# Patient Record
Sex: Male | Born: 1975 | Race: White | Hispanic: No | Marital: Married | Smoking: Never smoker
Health system: Southern US, Community
[De-identification: ages and names within clinical notes are randomized; demographics above are authoritative.]

## PROBLEM LIST (undated history)

## (undated) DIAGNOSIS — F32A Depression, unspecified: Secondary | ICD-10-CM

## (undated) DIAGNOSIS — R0602 Shortness of breath: Secondary | ICD-10-CM

## (undated) DIAGNOSIS — R600 Localized edema: Secondary | ICD-10-CM

## (undated) DIAGNOSIS — M549 Dorsalgia, unspecified: Secondary | ICD-10-CM

## (undated) DIAGNOSIS — M2550 Pain in unspecified joint: Secondary | ICD-10-CM

## (undated) DIAGNOSIS — Z9889 Other specified postprocedural states: Secondary | ICD-10-CM

## (undated) HISTORY — DX: Shortness of breath: R06.02

## (undated) HISTORY — PX: BACK SURGERY: SHX140

## (undated) HISTORY — PX: KNEE SURGERY: SHX244

## (undated) HISTORY — PX: TIBIA FRACTURE SURGERY: SHX806

## (undated) HISTORY — DX: Pain in unspecified joint: M25.50

## (undated) HISTORY — DX: Localized edema: R60.0

## (undated) HISTORY — DX: Other specified postprocedural states: Z98.890

## (undated) HISTORY — PX: ANKLE SURGERY: SHX546

## (undated) HISTORY — DX: Dorsalgia, unspecified: M54.9

## (undated) HISTORY — PX: HIP SURGERY: SHX245

## (undated) HISTORY — DX: Depression, unspecified: F32.A

---

## 2021-10-05 ENCOUNTER — Emergency Department: Payer: 59

## 2021-10-05 ENCOUNTER — Emergency Department
Admission: EM | Admit: 2021-10-05 | Discharge: 2021-10-05 | Disposition: A | Discharge status: Home / Self Care | Payer: 59 | Attending: Student in an Organized Health Care Education/Training Program | Admitting: Student in an Organized Health Care Education/Training Program

## 2021-10-05 ENCOUNTER — Other Ambulatory Visit: Payer: Self-pay

## 2021-10-05 DIAGNOSIS — G8911 Acute pain due to trauma: Secondary | ICD-10-CM | POA: Insufficient documentation

## 2021-10-05 DIAGNOSIS — M5414 Radiculopathy, thoracic region: Secondary | ICD-10-CM | POA: Diagnosis not present

## 2021-10-05 DIAGNOSIS — R102 Pelvic and perineal pain: Secondary | ICD-10-CM | POA: Diagnosis not present

## 2021-10-05 DIAGNOSIS — Y92410 Unspecified street and highway as the place of occurrence of the external cause: Secondary | ICD-10-CM | POA: Insufficient documentation

## 2021-10-05 DIAGNOSIS — R52 Pain, unspecified: Secondary | ICD-10-CM

## 2021-10-05 DIAGNOSIS — M545 Low back pain, unspecified: Secondary | ICD-10-CM | POA: Diagnosis present
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
## 2021-10-05 MED ORDER — PREDNISONE 10 MG (21) PO TBPK: ORAL_TABLET | ORAL | 0 refills | Status: AC

## 2021-10-05 MED ORDER — GADOBUTROL 1 MMOL/ML IV SOLN
10.0000 mL | Freq: Once | INTRAVENOUS | Status: AC | PRN
  Administered 2021-10-05: 10 mL via INTRAVENOUS
  Filled 2021-10-05: qty 10

## 2021-10-05 NOTE — ED Notes (Signed)
Pt remains away at imaging.  
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
## 2021-10-05 NOTE — Discharge Instructions (Addendum)
-  Please return to the emergency department anytime if you begin to experience any new or worsening symptoms. -Please take the steroids as prescribed. -Use Tylenol/ibuprofen as needed for pain.
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
## 2021-10-14 ENCOUNTER — Other Ambulatory Visit: Payer: Self-pay

## 2021-10-14 ENCOUNTER — Encounter: Payer: Self-pay | Admitting: Dermatology

## 2021-10-14 ENCOUNTER — Ambulatory Visit (INDEPENDENT_AMBULATORY_CARE_PROVIDER_SITE_OTHER): Payer: 59 | Admitting: Dermatology

## 2021-10-14 DIAGNOSIS — L409 Psoriasis, unspecified: Secondary | ICD-10-CM | POA: Diagnosis not present

## 2021-10-14 NOTE — Patient Instructions (Addendum)
If not started on Skyrizi when due for next Cimzia injected continue Cimzia.   If You Need Anything After Your Visit  If you have any questions or concerns for your doctor, please call our main line at [phone] and press option 4 to reach your doctor's medical assistant. If no one answers, please leave a voicemail as directed and we will return your call as soon as possible. Messages left after 4 pm will be answered the following business day.   You may also send [place] a message via MyChart. We typically respond to MyChart messages within 1-2 business days.  For prescription refills, please ask your pharmacy to contact our office. Our fax number is [phone].  If you have an urgent issue when the clinic is closed that cannot wait until the next business day, you can page your doctor at the number below.    Please note that while we do our best to be available for urgent issues outside of office hours, we are not available 24/7.   If you have an urgent issue and are unable to reach [place], you may choose to seek medical care at your doctor's office, retail clinic, urgent care center, or emergency room.  If you have a medical emergency, please immediately call 911 or go to the emergency department.  Pager Numbers  - Dr. Gwen Pounds: [phone]  - Dr. Neale Burly: [phone]  - Dr. Roseanne Reno: [phone]  In the event of inclement weather, please call our main line at [phone] for an update on the status of any delays or closures.  Dermatology Medication Tips: Please keep the boxes that topical medications come in in order to help keep track of the instructions about where and how to use these. Pharmacies typically print the medication instructions only on the boxes and not directly on the medication tubes.   If your medication is too expensive, please contact our office at [phone] option 4 or send [place] a message through MyChart.   We are unable to tell what your co-pay for medications  will be in advance as this is different depending on your insurance coverage. However, we may be able to find a substitute medication at lower cost or fill out paperwork to get insurance to cover a needed medication.   If a prior authorization is required to get your medication covered by your insurance company, please allow [place] 1-2 business days to complete this process.  Drug prices often vary depending on where the prescription is filled and some pharmacies may offer cheaper prices.  The website www.goodrx.com contains coupons for medications through different pharmacies. The prices here do not account for what the cost may be with help from insurance (it may be cheaper with your insurance), but the website can give you the price if you did not use any insurance.  - You can print the associated coupon and take it with your prescription to the pharmacy.  - You may also stop by our office during regular business hours and pick up a GoodRx coupon card.  - If you need your prescription sent electronically to a different pharmacy, notify our office through [facility] or by phone at [phone] option 4.     Si Usted Necesita Algo Despus de Su Visita  Tambin puede enviarnos un mensaje a travs de Clinical cytogeneticist. Por lo general respondemos a los mensajes de MyChart en el transcurso de 1 a 2 das hbiles.  Para renovar recetas, por favor pida a su farmacia que se ponga  en contacto con nuestra oficina. Annie Sable de fax es [place] [phone].  Si tiene un asunto urgente cuando la clnica est cerrada y que no puede esperar hasta el siguiente da hbil, puede llamar/localizar a su doctor(a) al nmero que aparece a continuacin.   Por favor, tenga en cuenta que aunque hacemos todo lo posible para estar disponibles para asuntos urgentes fuera del horario de [place], no estamos disponibles las 24 horas del da, los 7 [address]  [address] de la [place].   Si tiene un problema urgente y no puede comunicarse con  nosotros, puede optar por buscar atencin mdica  en el consultorio de su doctor(a), en una clnica privada, en un centro de atencin urgente o en una sala de emergencias.  Si tiene Engineer, drilling, por favor llame inmediatamente al 911 o vaya a la sala de emergencias.  Nmeros de bper  - Dr. Gwen Pounds: [phone]  - Dra. Moye: [phone]  - Dra. Roseanne Reno: [phone]  En caso de inclemencias del [place], por favor llame a Lacy Duverney principal al [phone] para una actualizacin sobre el [place] de cualquier retraso o cierre.  Consejos para la medicacin en dermatologa: Por favor, guarde las cajas en las que vienen los medicamentos de uso tpico para ayudarle a seguir las instrucciones sobre dnde y cmo usarlos. Las farmacias generalmente imprimen las instrucciones del medicamento slo en las cajas y no directamente en los tubos del [place].   Si su medicamento es muy caro, por favor, pngase en contacto con Rolm Gala llamando al [phone] y presione la opcin 4 o envenos un mensaje a travs de Clinical cytogeneticist.   No podemos decirle cul ser su copago por los medicamentos por adelantado ya que esto es diferente dependiendo de la cobertura de su seguro. Sin embargo, es posible que podamos encontrar un medicamento sustituto a Audiological scientist un formulario para que el seguro cubra el medicamento que se considera necesario.   Si se requiere una autorizacin previa para que su compaa de seguros [place] su medicamento, por favor permtanos de 1 a 2 das hbiles para completar [address]  Los precios de los medicamentos varan con frecuencia dependiendo del Environmental consultant de dnde se surte la receta y alguna farmacias pueden ofrecer precios ms baratos.  El sitio web www.goodrx.com tiene cupones para medicamentos de Health and safety inspector. Los precios aqu no tienen en cuenta lo que podra costar con la ayuda del seguro (puede ser ms barato con su seguro), pero el sitio web  puede darle el precio si no utiliz Tourist information centre manager.  - Puede imprimir el cupn correspondiente y llevarlo con su receta a la farmacia.  - Tambin puede pasar por nuestra oficina durante el horario de atencin regular y Education officer, museum una tarjeta de cupones de GoodRx.  - Si necesita que su receta se enve electrnicamente a una farmacia diferente, informe a nuestra oficina a travs de MyChart de [place] o por telfono llamando al [phone] y presione la opcin 4.

## 2021-10-14 NOTE — Progress Notes (Signed)
° °  New Patient Visit  Subjective  Danny Chandler is a 46 y.o. male who presents for the following: Psoriasis (Patient reports this comes and goes. Using Betamethasone cream. Has used Clobetasol cream in the past. Currently on oral Prednisone due to injury/accident. Hands, knees, elbows. Breaking out on chest/upper body while on Cimzia. Patient reports he had labs drawn at Dr. Dwana Curd office. Rash at right leg. Has been seen by Dr. Cheree Ditto. Dur: >1 year. Worsening. Spreading. Itches at times if overheated. ). No history of joint aches.  Referral from Dr. Carlynn Purl.   Review of Systems: No other skin or systemic complaints except as noted in HPI or Assessment and Plan.  Objective  Well appearing patient in no apparent distress; mood and affect are within normal limits.  A full examination was performed including scalp, head, eyes, ears, nose, lips, neck, chest, axillae, abdomen, back, buttocks, bilateral upper extremities, bilateral lower extremities, hands, feet, fingers, toes, fingernails, and toenails. All findings within normal limits unless otherwise noted below.  Right Lower Leg, B/L hands Well-demarcated erythematous papules/plaques with silvery scale.           Assessment & Plan  Psoriasis -severe with BSA of 12% while actively on and currently on Cimzia. The patient states he is much improved on Cimzia but has significant persistence of psoriasis and wonders if there is other options for improvement. No symptoms or signs of psoriatic arthritis. Right Lower Leg, B/L hands  Psoriasis is a chronic non-curable, but treatable genetic/hereditary disease that may have other systemic features affecting other organ systems such as joints (Psoriatic Arthritis). It is associated with an increased risk of inflammatory bowel disease, heart disease, non-alcoholic fatty liver disease, and depression.    BSA: 12% on Tx for 6 months with Cimzia  Will request labs from Dr.  Dwana Curd office.  Plan to start [facility] pending normal labs and insurance approval.  Lab order given today to use if needed.   If not started on Skyrizi when due for next Cimzia injected continue Cimzia.   Related Procedures Hepatitis B surface antigen Hepatitis B surface antibody,qualitative Hepatitis C antibody QuantiFERON-TB Gold Plus Hepatitis B core antibody, total  Return for Psoriasis Follow Up. 3-4 weeks.  I, Lawson Radar, CMA, am acting as scribe for Armida Sans, MD. Documentation: I have reviewed the above documentation for accuracy and completeness, and I agree with the above.  Armida Sans, MD

## 2021-10-15 ENCOUNTER — Encounter: Payer: Self-pay | Admitting: Dermatology

## 2021-10-22 ENCOUNTER — Ambulatory Visit
Admission: RE | Admit: 2021-10-22 | Discharge: 2021-10-22 | Disposition: A | Discharge status: Home / Self Care | Payer: 59 | Source: Ambulatory Visit | Attending: Internal Medicine | Admitting: Internal Medicine

## 2021-10-22 ENCOUNTER — Other Ambulatory Visit: Payer: Self-pay | Admitting: Internal Medicine

## 2021-10-22 DIAGNOSIS — R1084 Generalized abdominal pain: Secondary | ICD-10-CM

## 2021-10-22 DIAGNOSIS — K625 Hemorrhage of anus and rectum: Secondary | ICD-10-CM

## 2021-10-24 ENCOUNTER — Other Ambulatory Visit: Payer: Self-pay

## 2021-10-24 ENCOUNTER — Ambulatory Visit
Admission: RE | Admit: 2021-10-24 | Discharge: 2021-10-24 | Disposition: A | Discharge status: Home / Self Care | Payer: 59 | Source: Ambulatory Visit | Attending: Internal Medicine | Admitting: Internal Medicine

## 2021-10-24 DIAGNOSIS — R1084 Generalized abdominal pain: Secondary | ICD-10-CM | POA: Diagnosis present

## 2021-10-24 DIAGNOSIS — K625 Hemorrhage of anus and rectum: Secondary | ICD-10-CM | POA: Insufficient documentation

## 2021-10-24 IMAGING — CT CT ABD-PELV W/ CM
2 of 5 series · 15 of 46 positions shown, 17 images · IV contrast (APPLIED)
Comparison: CT L-spine [DATE]

CLINICAL DATA: Abdominal pain with rectal bleeding since [DATE]

EXAM:
CT ABDOMEN AND PELVIS WITH CONTRAST
TECHNIQUE: Multidetector CT imaging of the abdomen and pelvis was performed
using the standard protocol following bolus administration of
intravenous contrast.

[Series 2: routine abd/pel with · axial · 0.94mm/px · z∈[-1250,-755]mm · 12 of 113 slices shown, 14 images]
[im 7/113  soft-tissue]
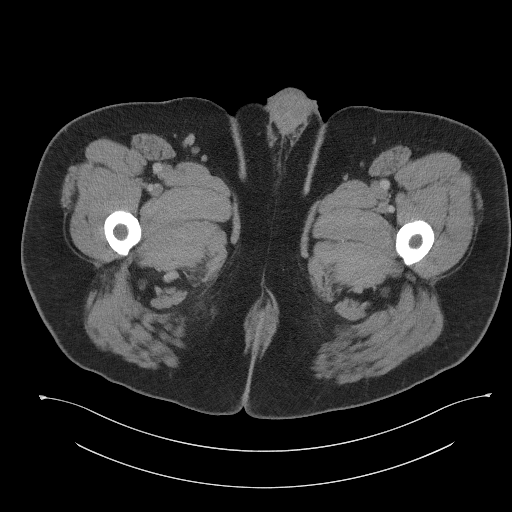
[im 7/113  bone]
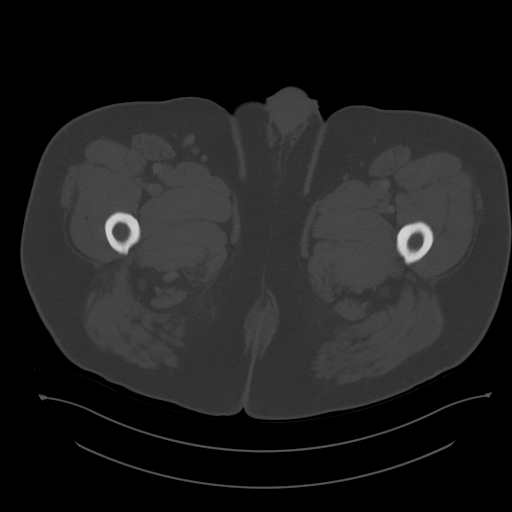
[im 19/113  soft-tissue]
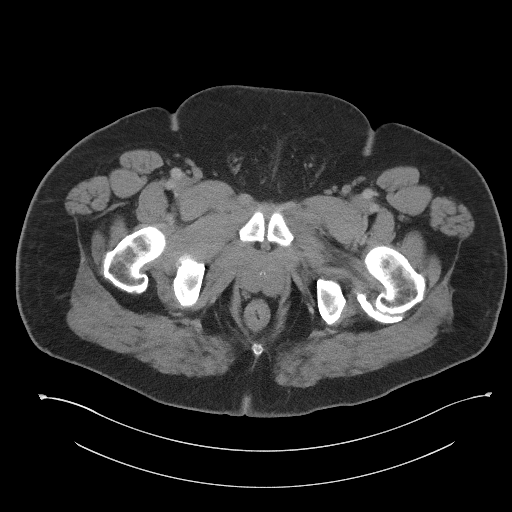
[im 25/113  soft-tissue]
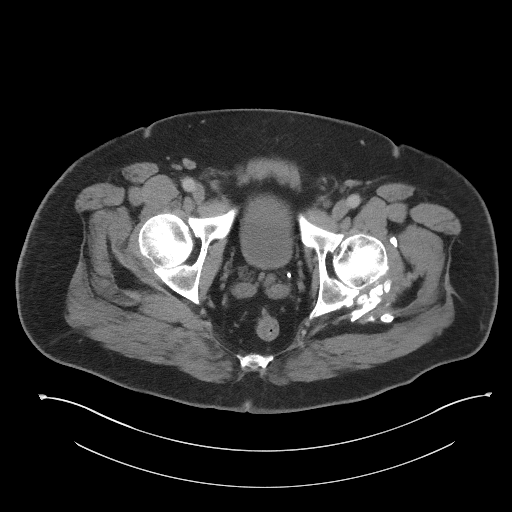
[im 32/113  soft-tissue]
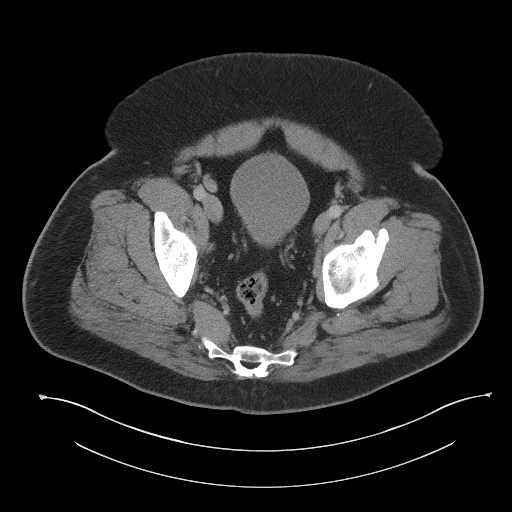
[im 44/113  soft-tissue]
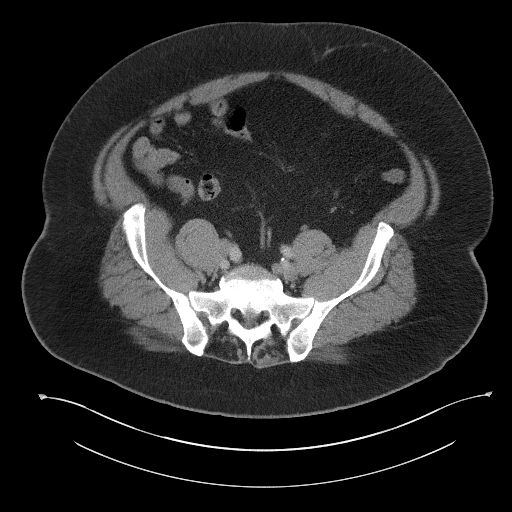
[im 50/113  soft-tissue]
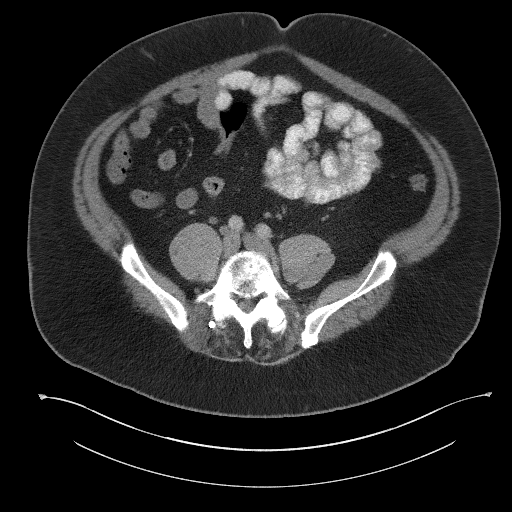
[im 63/113  soft-tissue]
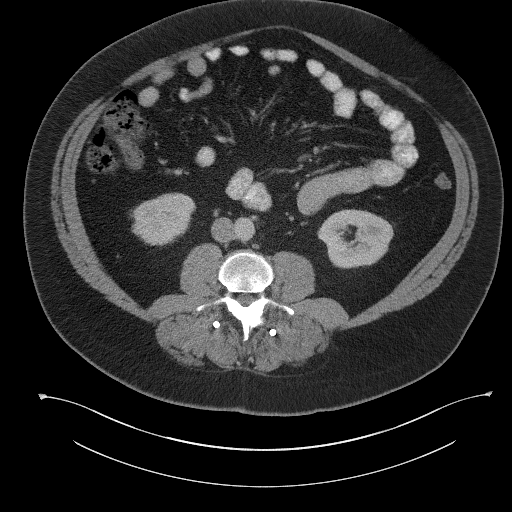
[im 69/113  soft-tissue]
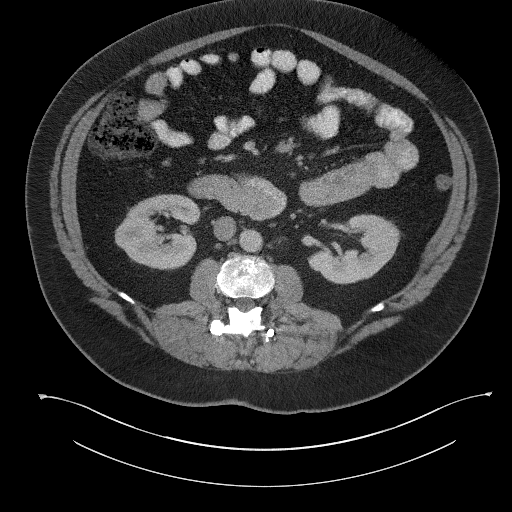
[im 81/113  soft-tissue]
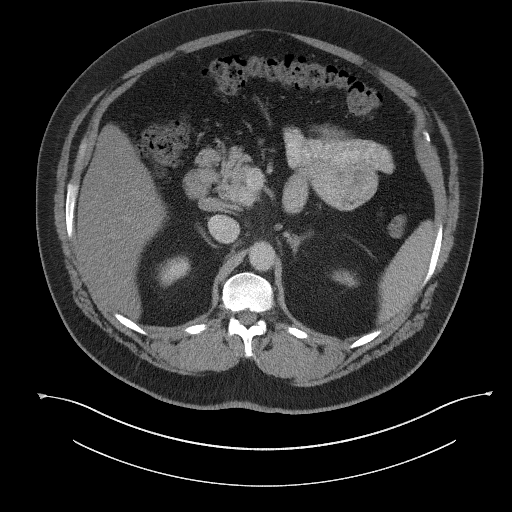
[im 81/113  bone]
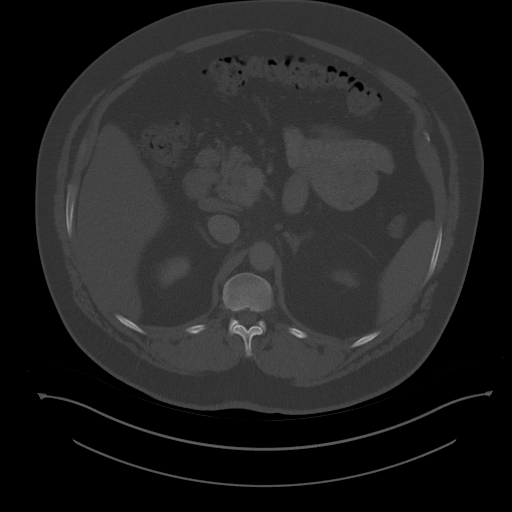
[im 88/113  soft-tissue]
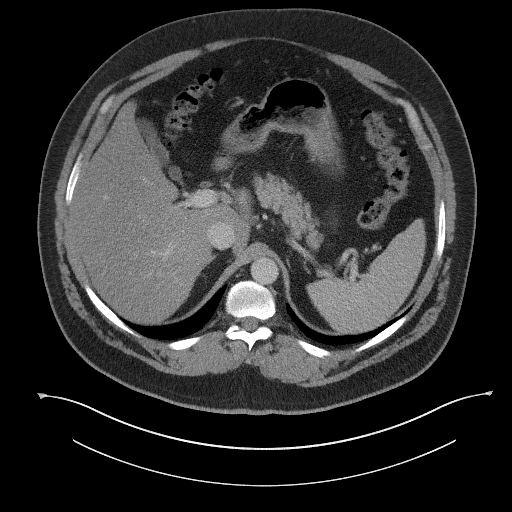
[im 94/113  soft-tissue]
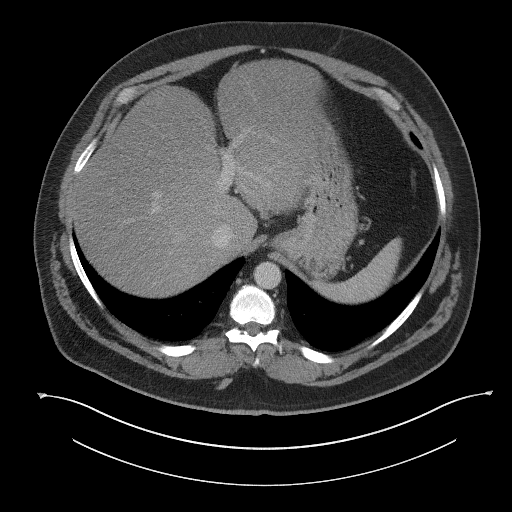
[im 106/113  soft-tissue]
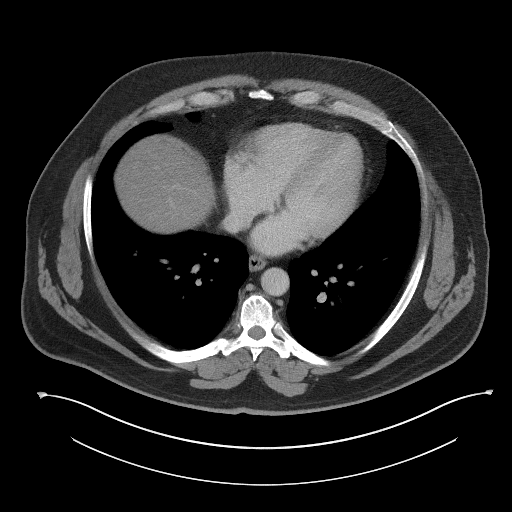

[Series 5: coronal st · coronal · 0.92mm/px · 3 of 134 slices shown]
[im 45/134  soft-tissue]
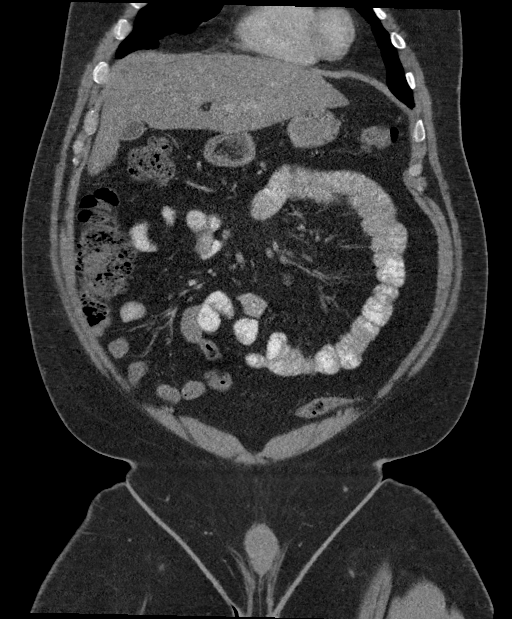
[im 60/134  soft-tissue]
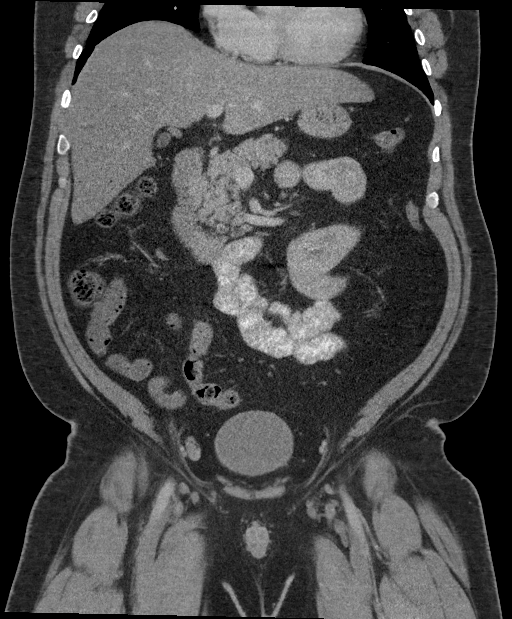
[im 74/134  soft-tissue]
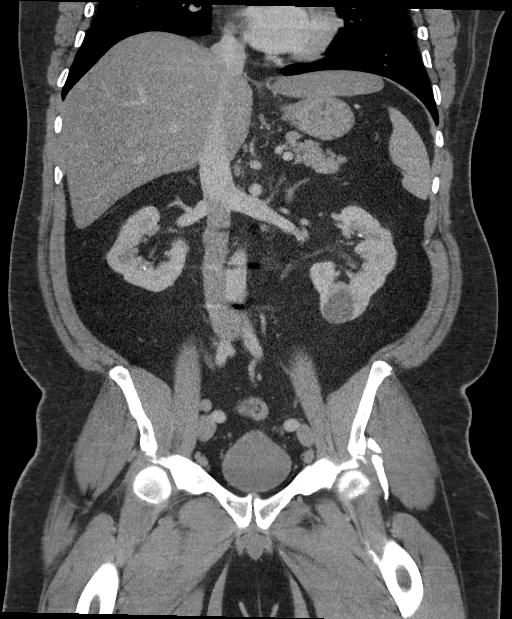

[15 of 46 positions shown; findings below may reference images not displayed]

RADIATION DOSE REDUCTION: This exam was performed according to the
departmental dose-optimization program which includes automated
exposure control, adjustment of the mA and/or kV according to
patient size and/or use of iterative reconstruction technique.

CONTRAST:  100mL OMNIPAQUE IOHEXOL 350 MG/ML SOLN
FINDINGS: Lower chest: No acute abnormality.

Hepatobiliary: Hepatic steatosis. Gallbladder is unremarkable. No
biliary ductal dilation.

Pancreas: No pancreatic ductal dilation or evidence of acute
inflammation

Spleen: Normal in size without focal abnormality.

Adrenals/Urinary Tract: Bilateral adrenal glands appear normal. No
hydronephrosis. Nonobstructive punctate bilateral renal stones
measuring up to 4 mm. Hypodense 3.2 cm left lower pole renal lesion
demonstrates some mural calcification and Hounsfield units slightly
greater than that expected for a simple cyst. Urinary bladder is
unremarkable for degree of distension.

Stomach/Bowel: Radiopaque ingested material traverses distal loops
of small bowel. Stomach is unremarkable for degree of distension. No
pathologic dilation of small or large bowel. The appendix and
terminal ileum appear normal. Small volume of formed stool in the
ascending and transverse colon with the descending and sigmoid colon
relatively decompressed limiting evaluation. There is mild symmetric
wall thickening of the rectum which may reflect underdistention.

Vascular/Lymphatic: No abdominal aortic aneurysm. No pathologically
enlarged abdominal or pelvic lymph nodes.

Reproductive: Prostate is unremarkable.

Other: No significant abdominopelvic free fluid.

Musculoskeletal: Posterior lumbar fixation hardware with inter body
disc spacers. Heterotopic ossification and irregularity about the
left hip likely reflect sequela of prior trauma. No acute osseous
abnormality.
IMPRESSION: 1. Mild symmetric wall thickening of the rectum which may reflect
underdistention, consider clinical correlation in this patient with
reported rectal bleeding.
2. Hepatic steatosis.
3. Nonobstructive punctate bilateral renal stones measuring up to 4
mm.
4. Hypodense 3.2 cm left lower pole renal lesion demonstrates some
mural calcification and Hounsfield units slightly greater than that
expected for a simple cyst, possibly reflecting a
hemorrhagic/proteinaceous cyst however renal neoplasm not excluded.
Consider further evaluation with nonemergent renal protocol CT or
MRI with and without contrast.

## 2021-10-24 MED ORDER — IOHEXOL 350 MG/ML SOLN
100.0000 mL | Freq: Once | INTRAVENOUS | Status: AC | PRN
  Administered 2021-10-24: 100 mL via INTRAVENOUS

## 2021-10-25 ENCOUNTER — Other Ambulatory Visit: Payer: Self-pay | Admitting: Internal Medicine

## 2021-10-25 DIAGNOSIS — N2889 Other specified disorders of kidney and ureter: Secondary | ICD-10-CM

## 2021-10-28 ENCOUNTER — Telehealth: Payer: Self-pay

## 2021-10-28 NOTE — Telephone Encounter (Signed)
Patient's records from [facility] in history (under chart review and then media).   Patient is due for Cimzia but last note you stated to send in Skyrizi to start PA process. Okay to do now?  Labs were placed in your tray to be signed off on.

## 2021-10-29 ENCOUNTER — Other Ambulatory Visit: Payer: Self-pay | Admitting: Family Medicine

## 2021-10-29 DIAGNOSIS — M5412 Radiculopathy, cervical region: Secondary | ICD-10-CM

## 2021-10-29 MED ORDER — SKYRIZI PEN 150 MG/ML ~~LOC~~ SOAJ: 150.0000 mg | SUBCUTANEOUS | 2 refills | Status: DC

## 2021-10-29 MED ORDER — SKYRIZI PEN 150 MG/ML ~~LOC~~ SOAJ: 150.0000 mg | SUBCUTANEOUS | 1 refills | Status: DC

## 2021-10-29 NOTE — Telephone Encounter (Signed)
Skyrizi RX sent in to [facility] and patient advised. aw

## 2021-10-30 ENCOUNTER — Ambulatory Visit
Admission: RE | Admit: 2021-10-30 | Discharge: 2021-10-30 | Disposition: A | Discharge status: Home / Self Care | Payer: 59 | Source: Ambulatory Visit | Attending: Family Medicine | Admitting: Family Medicine

## 2021-10-30 DIAGNOSIS — M5412 Radiculopathy, cervical region: Secondary | ICD-10-CM | POA: Insufficient documentation
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
## 2021-11-03 ENCOUNTER — Ambulatory Visit: Payer: 59

## 2021-11-10 ENCOUNTER — Ambulatory Visit: Payer: 59

## 2021-11-11 ENCOUNTER — Ambulatory Visit: Payer: 59 | Admitting: Dermatology

## 2021-11-27 NOTE — Progress Notes (Signed)
11/29/21 8:01 AM   Danny Chandler 1976/02/27 [id number]  Referring provider:  Danella Penton, MD [id number] [facility] [address] [facility] [place],  [place] [address] Chief Complaint  Patient presents with   Urinary Frequency     HPI: Danny Chandler is a 46 y.o.male who presents today for further evaluation of renal cyst.   He was seen by his PCP on 10/22/2021 with concerns of rectal bleeding and abdominal pain that began on 10/09/2021. CT abdomen and pelvis on 10/24/2021 to further evaluate visualized Nonobstructive punctate bilateral renal stones measuring up to 20mm. Hypodense 3.2 cm left lower pole renal lesion demonstrates some mural calcification and Hounsfield units slightly greater than that expected for a simple cyst, possibly reflecting a hemorrhagic/proteinaceous cyst however renal neoplasm not excluded.   His PCP further ordered an MRI for the left lower kidney cyst.  Unfortunately, today he reports he is having trouble getting this approved by insurance.  This is something to do with outstanding Work[employer].  He also reports today that he is having increasing urinary frequency.  He reports that he has times when he feels he is not voiding fully.   He reports today that he has not had any previous kidney stones before. He reports that he drink [employer] and Dr Reino Kent he reports that he eats spinach at least 4 times weekly.   PMH: No past medical history on file.  Surgical History: Past Surgical History:  Procedure Laterality Date   ANKLE SURGERY Left    ANKLE SURGERY Right    BACK SURGERY     HIP SURGERY Left    KNEE SURGERY     KNEE SURGERY Right    TIBIA FRACTURE SURGERY Left     Home Medications:  Allergies as of 11/28/2021       Reactions   Bee Venom Anaphylaxis   Penicillins Anaphylaxis        Medication List        Accurate as of November 28, 2021 11:59 PM. If you have any questions, ask your nurse or doctor.           betamethasone dipropionate 0.05 % cream SMARTSIG:Sparingly Topical Daily   Cimzia Prefilled 2 X 200 MG/ML Pskt Generic drug: certolizumab pegol Inject into the skin.   clobetasol cream 0.05 % Commonly known as: TEMOVATE Apply topically 2 (two) times daily.   cyanocobalamin 1000 MCG/ML injection Commonly known as: (VITAMIN B-12) Inject 1,000 mcg into the muscle every 30 (thirty) days.   Skyrizi Pen 150 MG/ML Soaj Generic drug: Risankizumab-rzaa Inject 150 mg into the skin as directed. At weeks 0 & 4.   Skyrizi Pen 150 MG/ML Soaj Generic drug: Risankizumab-rzaa Inject 150 mg into the skin as directed. Every 12 weeks for maintenance.        Allergies:  Allergies  Allergen Reactions   Bee Venom Anaphylaxis   Penicillins Anaphylaxis    Family History: No family history on file.  Social History:  reports that he has never smoked. He has never used smokeless tobacco. He reports current alcohol use. No history on file for drug use.   Physical Exam: BP (!) 138/93    Pulse 92    Ht 6\' 1"  (1.854 m)    Wt (!) 318 lb (144.2 kg)    BMI 41.96 kg/m   Constitutional:  Alert and oriented, No acute distress. HEENT: [place] AT, moist mucus membranes.  Trachea midline, no masses. Cardiovascular: No clubbing, cyanosis, or edema.  Respiratory: Normal respiratory effort, no increased work of breathing. Skin: No rashes, bruises or suspicious lesions. Neurologic: Grossly intact, no focal deficits, moving all 4 extremities. Psychiatric: Normal mood and affect.  Urinalysis Results for orders placed or performed in visit on 11/28/21  Microscopic Examination   Urine  Result Value Ref Range   WBC, UA 0-5 0 - 5 /hpf   RBC 0-2 0 - 2 /hpf   Epithelial Cells (non renal) None seen 0 - 10 /hpf   Mucus, UA Present (A) Not Estab.   Bacteria, UA None seen None seen/Few  Urinalysis, Complete  Result Value Ref Range   Specific Gravity, UA >1.030 (H) 1.005 - 1.030   pH, UA 5.5 5.0 - 7.5   Color,  UA Yellow Yellow   Appearance Ur Clear Clear   Leukocytes,UA Negative Negative   Protein,UA 1+ (A) Negative/Trace   Glucose, UA Negative Negative   Ketones, UA Negative Negative   RBC, UA Negative Negative   Bilirubin, UA Negative Negative   Urobilinogen, Ur 1.0 0.2 - 1.0 mg/dL   Nitrite, UA Negative Negative   Microscopic Examination See below:      Pertinent Imaging: CLINICAL DATA:  Abdominal pain with rectal bleeding since October 09, 2021   EXAM: CT ABDOMEN AND PELVIS WITH CONTRAST   TECHNIQUE: Multidetector CT imaging of the abdomen and pelvis was performed using the standard protocol following bolus administration of intravenous contrast.   RADIATION DOSE REDUCTION: This exam was performed according to the departmental dose-optimization program which includes automated exposure control, adjustment of the mA and/or kV according to patient size and/or use of iterative reconstruction technique.   CONTRAST:  OMNIPAQUE IOHEXOL 350 MG/ML SOLN   COMPARISON:  CT L-spine October 05, 2021   FINDINGS: Lower chest: No acute abnormality.   Hepatobiliary: Hepatic steatosis. Gallbladder is unremarkable. No biliary ductal dilation.   Pancreas: No pancreatic ductal dilation or evidence of acute inflammation   Spleen: Normal in size without focal abnormality.   Adrenals/Urinary Tract: Bilateral adrenal glands appear normal. No hydronephrosis. Nonobstructive punctate bilateral renal stones measuring up to 4 mm. Hypodense 3.2 cm left lower pole renal lesion demonstrates some mural calcification and Hounsfield units slightly greater than that expected for a simple cyst. Urinary bladder is unremarkable for degree of distension.   Stomach/Bowel: Radiopaque ingested material traverses distal loops of small bowel. Stomach is unremarkable for degree of distension. No pathologic dilation of small or large bowel. The appendix and terminal ileum appear normal. Small volume of  formed stool in the ascending and transverse colon with the descending and sigmoid colon relatively decompressed limiting evaluation. There is mild symmetric wall thickening of the rectum which may reflect underdistention.   Vascular/Lymphatic: No abdominal aortic aneurysm. No pathologically enlarged abdominal or pelvic lymph nodes.   Reproductive: Prostate is unremarkable.   Other: No significant abdominopelvic free fluid.   Musculoskeletal: Posterior lumbar fixation hardware with inter body disc spacers. Heterotopic ossification and irregularity about the left hip likely reflect sequela of prior trauma. No acute osseous abnormality.   IMPRESSION: 1. Mild symmetric wall thickening of the rectum which may reflect underdistention, consider clinical correlation in this patient with reported rectal bleeding. 2. Hepatic steatosis. 3. Nonobstructive punctate bilateral renal stones measuring up to 4 mm. 4. Hypodense 3.2 cm left lower pole renal lesion demonstrates some mural calcification and Hounsfield units slightly greater than that expected for a simple cyst, possibly reflecting a hemorrhagic/proteinaceous cyst however renal neoplasm not excluded. Consider further evaluation with  nonemergent renal protocol CT or MRI with and without contrast.     Electronically Signed   By: Maudry Mayhew M.D.   On: 10/24/2021 10:35   I have personally reviewed the images and agree with radiologist interpretation.   Assessment & Plan:   Kidney stone  - We discussed that management is based on stone size, location, density, patient co-morbidities, and patient preference.  Stones <18mm in size have a >80% spontaneous passage rate. Data surrounding the use of tamsulosin for medical expulsive therapy is controversial, but meta analyses suggests it is most efficacious for distal stones between 5-94mm in size. Possible side effects include dizziness/lightheadedness, and retrograde ejaculation. - We  discussed general stone prevention techniques including drinking plenty water with goal of producing 2.5 L urine daily, increased citric acid intake, avoidance of high oxalate containing foods, and decreased salt intake.  Information about dietary recommendations given today. -Would recommend conservative management for the stones, treat only if becomes symptomatic  Renal cyst  - We reviewed the Bosniak classification and discussed that Bosniak 42F lesion Bosniak 3 lesions harbor a 50% chance of malignancy whereas Bosniak 4 cysts have a solid and 90-95% are malignant in nature.  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 Urinary frequency -We discussed behavioral management today, at this point his symptoms are fairly minimal but may progress as he ages.  We did go ahead discussed options including pharmacological versus procedural if they worsen.   Will call him with results of MRI  Tawni Millers as a scribe for Vanna Scotland, MD.,have documented all relevant documentation on the behalf of Vanna Scotland, MD,as directed by  Vanna Scotland, MD while in the presence of Vanna Scotland, MD.  I have reviewed the above documentation for accuracy and completeness, and I agree with the above.   Vanna Scotland, MD   [facility] [address], [address] [place], [place] [address] [phone]

## 2021-11-28 ENCOUNTER — Ambulatory Visit (INDEPENDENT_AMBULATORY_CARE_PROVIDER_SITE_OTHER): Payer: 59 | Admitting: Urology

## 2021-11-28 ENCOUNTER — Other Ambulatory Visit: Payer: Self-pay

## 2021-11-28 VITALS — BP 138/93 | HR 92 | Ht 73.0 in | Wt 318.0 lb

## 2021-11-28 DIAGNOSIS — N200 Calculus of kidney: Secondary | ICD-10-CM

## 2021-11-28 DIAGNOSIS — N2889 Other specified disorders of kidney and ureter: Secondary | ICD-10-CM

## 2021-11-28 DIAGNOSIS — R350 Frequency of micturition: Secondary | ICD-10-CM | POA: Diagnosis not present

## 2021-11-28 LAB — MICROSCOPIC EXAMINATION
Bacteria, UA: NONE SEEN
Epithelial Cells (non renal): NONE SEEN /hpf (ref 0–10)

## 2021-11-28 LAB — URINALYSIS, COMPLETE
Bilirubin, UA: NEGATIVE
Glucose, UA: NEGATIVE
Ketones, UA: NEGATIVE
Leukocytes,UA: NEGATIVE
Nitrite, UA: NEGATIVE
RBC, UA: NEGATIVE
Specific Gravity, UA: >1.03 — ABNORMAL HIGH (ref 1.005–1.030)
Urobilinogen, Ur: 1 mg/dL (ref 0.2–1.0)
pH, UA: 5.5 (ref 5.0–7.5)

## 2021-12-15 ENCOUNTER — Other Ambulatory Visit: Payer: Self-pay

## 2021-12-15 ENCOUNTER — Ambulatory Visit
Admission: RE | Admit: 2021-12-15 | Discharge: 2021-12-15 | Disposition: A | Discharge status: Home / Self Care | Payer: 59 | Source: Ambulatory Visit | Attending: Urology | Admitting: Urology

## 2021-12-15 DIAGNOSIS — N2889 Other specified disorders of kidney and ureter: Secondary | ICD-10-CM | POA: Diagnosis present

## 2021-12-15 DIAGNOSIS — R350 Frequency of micturition: Secondary | ICD-10-CM | POA: Diagnosis present

## 2021-12-15 DIAGNOSIS — N200 Calculus of kidney: Secondary | ICD-10-CM | POA: Insufficient documentation

## 2021-12-15 IMAGING — MR MR ABDOMEN WO/W CM
18 series · 48 of 48 positions shown · IV contrast (gadavist)
Comparison: CT [DATE]

CLINICAL DATA: Further evaluation of renal lesion seen on prior CT.

EXAM:
MRI ABDOMEN WITHOUT AND WITH CONTRAST
TECHNIQUE: Multiplanar multisequence MR imaging of the abdomen was performed
both before and after the administration of intravenous contrast.
CONTRAST:  10mL GADAVIST GADOBUTROL 1 MMOL/ML IV SOLN

[Series 3: T2 · coronal · 6.0mm · 1.31mm/px · 2 of 38 slices shown (1 of 2)]
[im 1/38]
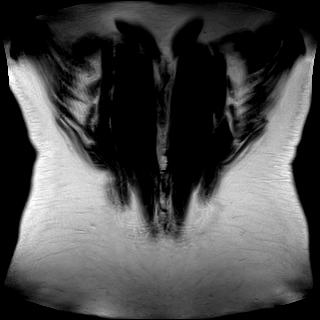
[im 38/38]
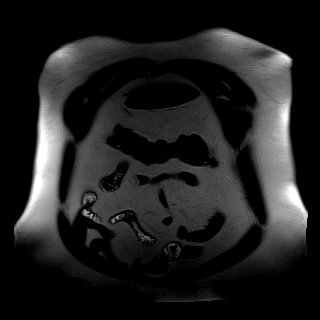

[Series 4: T2 · axial · 6.0mm · 1.31mm/px · z∈[-160,+92]mm · 2 of 36 slices shown (2 of 2)]
[im 1/36]
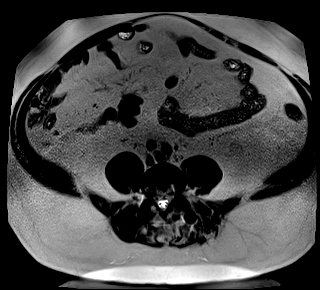
[im 36/36]
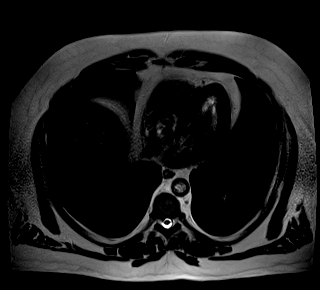

[Series 5: T1 · axial · 3.0mm · 1.31mm/px · z∈[-165,+96]mm · 4 of 88 slices shown (1 of 2)]
[im 1/88]
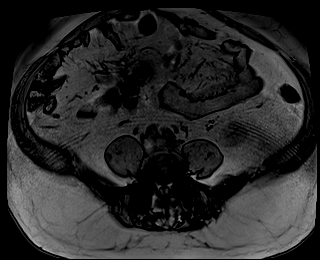
[im 30/88]
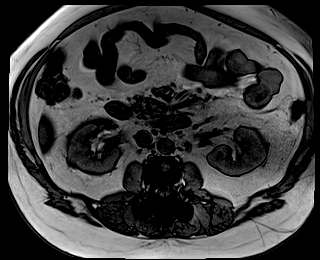
[im 59/88]
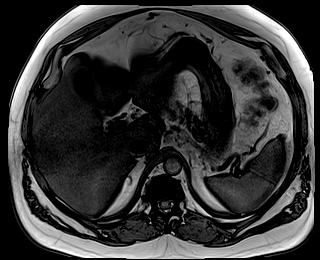
[im 88/88]
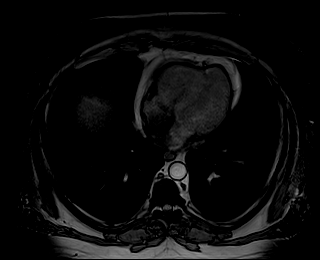

[Series 6: T1 · axial · 3.0mm · 1.31mm/px · z∈[-165,+96]mm · 3 of 88 slices shown (2 of 2)]
[im 1/88]
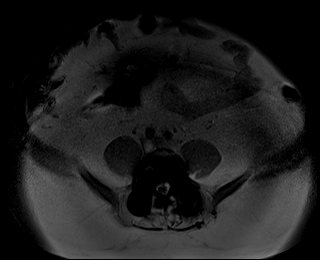
[im 44/88]
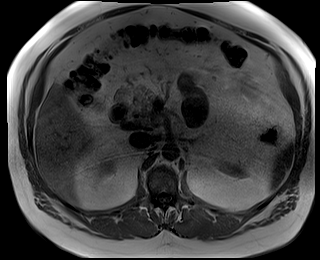
[im 88/88]
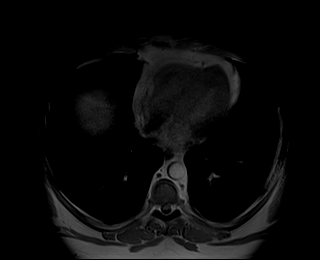

[Series 7: bSSFP · axial · 6.0mm · 0.66mm/px · 1 of 36 slices shown]
[im 1/36]
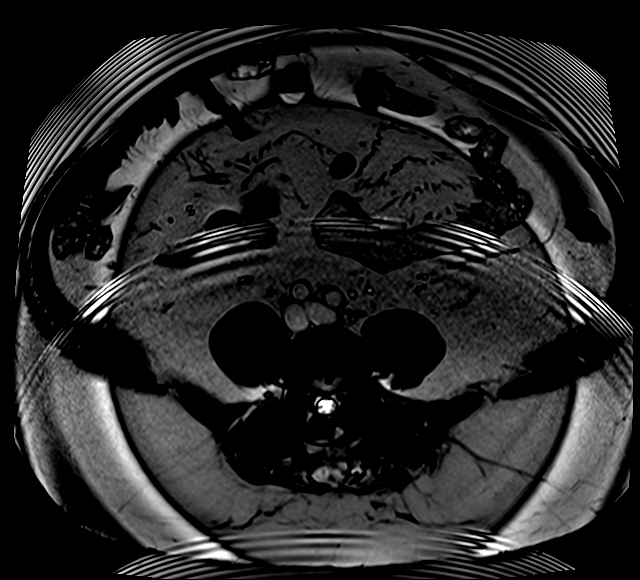

[Series 8: ax dwi_tracew · axial · 6.0mm · 1.57mm/px · z∈[-158,+94]mm · 4 of 108 slices shown]
[im 1/108]
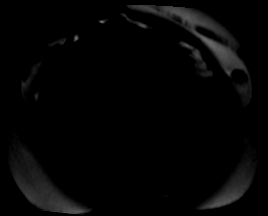
[im 36/108]
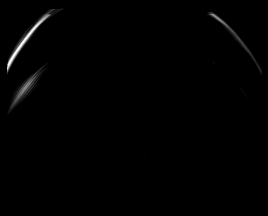
[im 72/108]
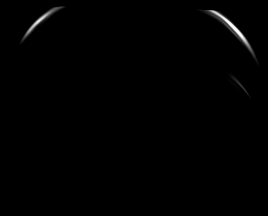
[im 108/108]
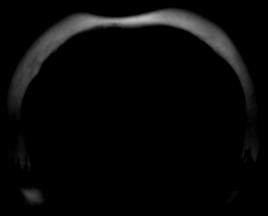

[Series 9: ax dwi_adc · axial · 6.0mm · 1.57mm/px · 1 of 36 slices shown]
[im 1/36]
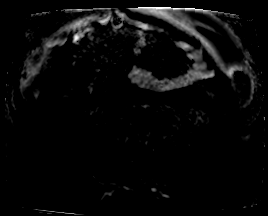

[Series 16: T2 fat-sat · axial · 6.0mm · 1.19mm/px · 1 of 36 slices shown]
[im 1/36]
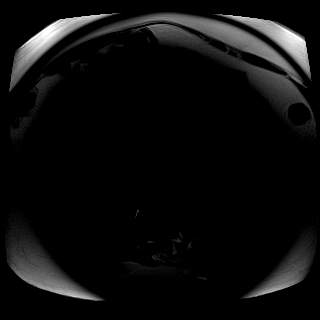

[Series 18: T1 dynamic fat-sat · axial · non-contrast · 3.0mm · 1.31mm/px · z∈[-172,+65]mm · 3 of 80 slices shown]
[im 1/80]
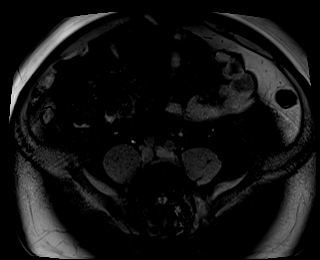
[im 40/80]
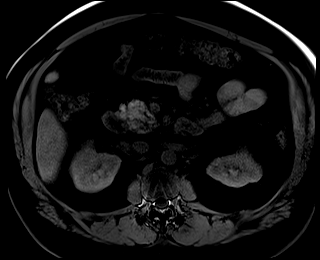
[im 80/80]
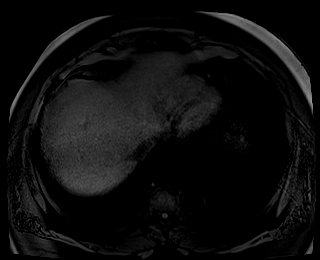

[Series 19: T1 dynamic fat-sat post-contrast · axial · 3.0mm · 1.31mm/px · z∈[-172,+65]mm · 3 of 80 slices shown (1 of 8)]
[im 1/80]
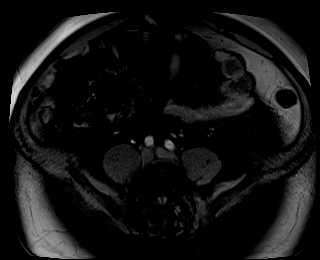
[im 40/80]
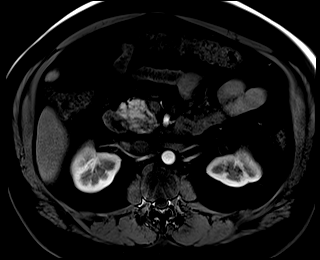
[im 80/80]
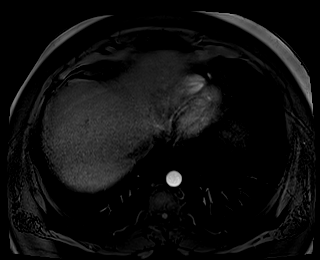

[Series 20: T1 dynamic fat-sat post-contrast · axial · 3.0mm · 1.31mm/px · z∈[-172,+65]mm · 3 of 80 slices shown (2 of 8)]
[im 1/80]
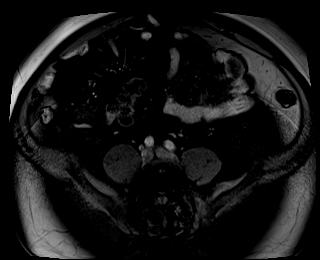
[im 40/80]
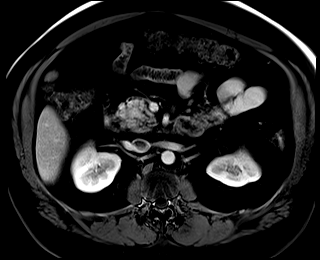
[im 80/80]
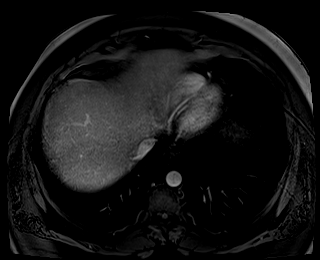

[Series 21: T1 dynamic fat-sat post-contrast · axial · 3.0mm · 1.31mm/px · z∈[-172,+65]mm · 3 of 80 slices shown (3 of 8)]
[im 1/80]
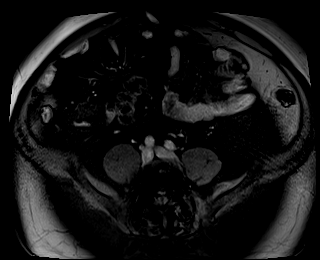
[im 40/80]
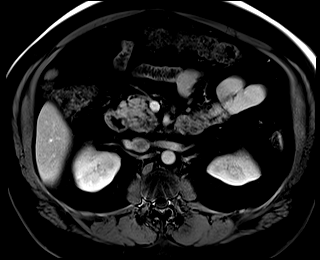
[im 80/80]
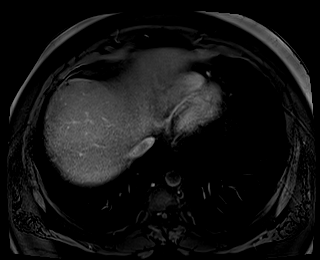

[Series 22: T1 dynamic post-contrast · coronal · 3.5mm · 1.44mm/px · 3 of 80 slices shown]
[im 1/80]
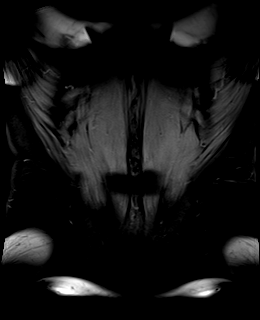
[im 40/80]
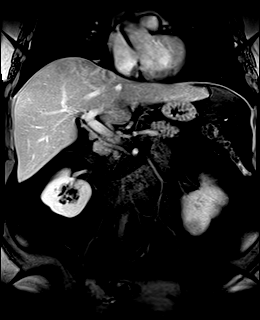
[im 80/80]
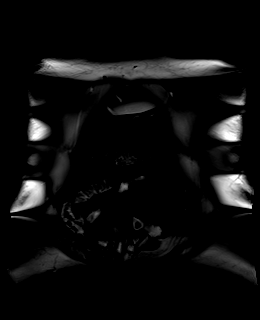

[Series 23: T1 dynamic fat-sat post-contrast · axial · 3.0mm · 1.31mm/px · z∈[-172,+65]mm · 3 of 80 slices shown (4 of 8)]
[im 1/80]
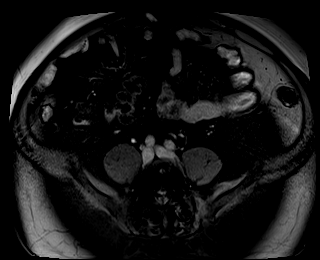
[im 40/80]
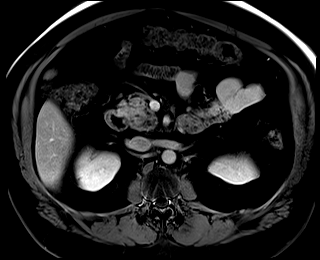
[im 80/80]
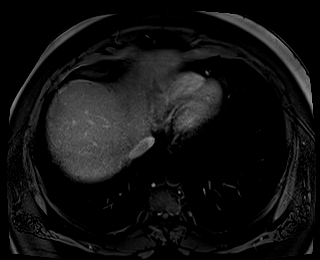

[Series 1025: T1 dynamic fat-sat post-contrast · axial · 3.0mm · 1.31mm/px · z∈[-172,+65]mm · 3 of 80 slices shown (5 of 8)]
[im 1/80]
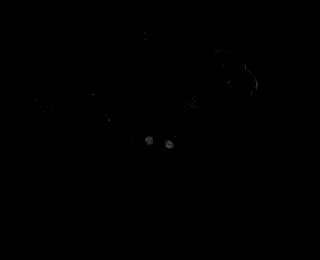
[im 40/80]
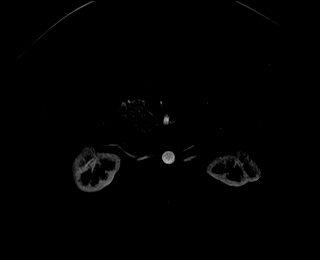
[im 80/80]
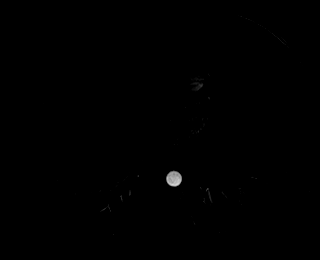

[Series 1027: T1 dynamic fat-sat post-contrast · axial · 3.0mm · 1.31mm/px · z∈[-172,+65]mm · 3 of 80 slices shown (6 of 8)]
[im 1/80]
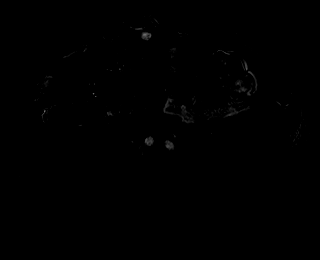
[im 40/80]
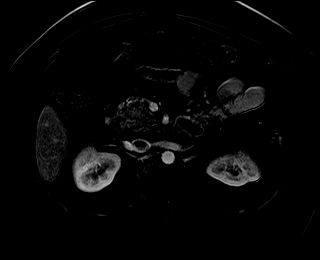
[im 80/80]
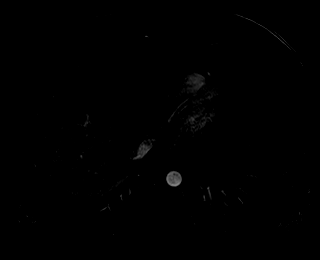

[Series 1029: T1 dynamic fat-sat post-contrast · axial · 3.0mm · 1.31mm/px · z∈[-172,+65]mm · 3 of 80 slices shown (7 of 8)]
[im 1/80]
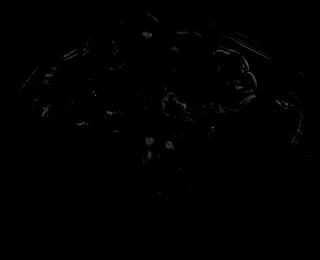
[im 40/80]
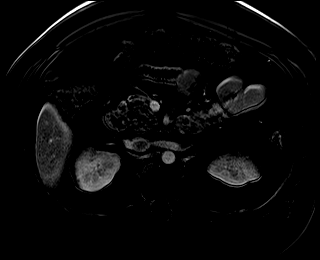
[im 80/80]
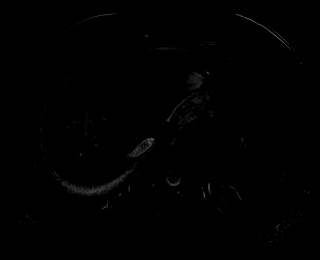

[Series 1031: T1 dynamic fat-sat post-contrast · axial · 3.0mm · 1.31mm/px · z∈[-172,+65]mm · 3 of 80 slices shown (8 of 8)]
[im 1/80]
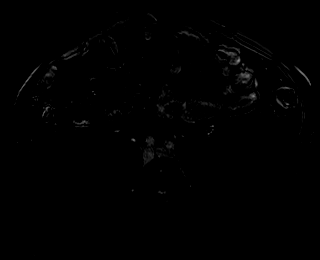
[im 40/80]
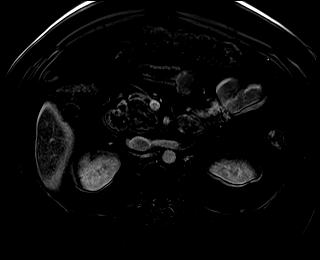
[im 80/80]
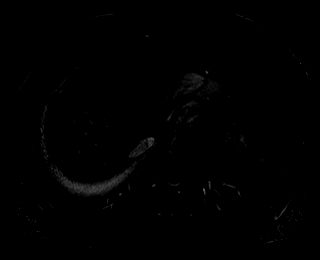

[48 of 48 positions shown; findings below may reference images not displayed]

FINDINGS: Lower chest: No acute abnormality.

Hepatobiliary: Diffuse hepatic steatosis. No suspicious hepatic
lesion. Gallbladder is unremarkable. No biliary ductal dilation.

Pancreas: Intrinsic T1 signal of the pancreatic parenchyma is within
normal limits. No pancreatic ductal dilation. Homogeneous
postcontrast enhancement of the pancreas. No cystic or solid
hyperenhancing pancreatic lesion identified.

Spleen:  No splenomegaly or focal splenic lesion.

Adrenals/Urinary Tract: Bilateral adrenal glands appear normal. No
hydronephrosis. T2 hyperintense well-circumscribed 3 cm left lower
pole renal lesion demonstrates some layering intrinsic T1
hyperintense T2 hypointense signal, without postcontrast
enhancement, consistent with a benign Bosniak classification 2
hemorrhagic/proteinaceous cyst. No solid enhancing renal mass.
Additional tiny bilateral T2 hyperintense renal lesions are too
small to characterize but are statistically likely to reflect cysts.

Stomach/Bowel: Visualized portions within the abdomen are
unremarkable.

Vascular/Lymphatic: Prominent periportal/upper abdominal lymph nodes
are not pathologically enlarged by size criteria and in the setting
of hepatic steatosis are favored reactive. No pathologically
enlarged lymph nodes identified. No abdominal aortic aneurysm
demonstrated.

Other:  No significant abdominal free fluid.

Musculoskeletal: No suspicious bone lesions identified.
IMPRESSION: 1. Benign 3 cm Bosniak classification 2 left lower pole renal cyst
corresponding with the lesion seen on prior CT
2. Additional tiny bilateral T2 hyperintense renal lesions are too
small to characterize but are statistically likely reflect 2 reflect
cysts.
3. Diffuse hepatic steatosis.
4. Prominent periportal/upper abdominal lymph nodes are not
pathologically enlarged by size criteria and in the setting of
hepatic steatosis are favored reactive.

## 2021-12-15 MED ORDER — GADOBUTROL 1 MMOL/ML IV SOLN
10.0000 mL | Freq: Once | INTRAVENOUS | Status: AC | PRN
  Administered 2021-12-15: 10 mL via INTRAVENOUS

## 2021-12-16 ENCOUNTER — Telehealth: Payer: Self-pay | Admitting: *Deleted

## 2021-12-16 NOTE — Telephone Encounter (Addendum)
Patient informed, voiced understanding.  ? ?----- Message from Vanna Scotland, MD sent at 12/16/2021  8:50 AM EDT ----- ?Please let this patient know that his cyst is benign, does not require any further surveillance.  Great news. ? ?Vanna Scotland, MD ? ?

## 2022-04-11 ENCOUNTER — Telehealth: Payer: Self-pay

## 2022-04-11 NOTE — Telephone Encounter (Signed)
I would need details such as CPT codes before I could request a quote from the pre-service center.

## 2022-04-11 NOTE — Telephone Encounter (Signed)
Are you able to provide any CPT codes? Or not until you see him again?

## 2022-04-11 NOTE — Telephone Encounter (Signed)
-----   Message from Rockey Situ sent at 04/11/2022 10:53 AM EDT ----- Regarding: discuss sx Contact: [phone] Patient is calling to schedule appt with Dr.Yarbrough to discuss surgery. He has been discharged from PT and notes are in his chart. He is down to 305 and Dr.Yarbrough said at his last visit 02/04/2022 weight goal is 270. At this moment he has no insurance. He is to meet with the lawyer to [place] next Wednesday for a payout. Can you tell him about how much the surgery will cost him before that his meeting with the lawyer?

## 2022-04-14 NOTE — Telephone Encounter (Signed)
Left detailed message on patients voicemail with information 

## 2022-04-14 NOTE — Telephone Encounter (Signed)
Patient called back that he did receive my voice mail. He had not further questions.

## 2022-04-15 ENCOUNTER — Telehealth: Payer: Self-pay

## 2022-04-15 NOTE — Telephone Encounter (Signed)
-----   Message from Venetia Night, MD sent at 04/15/2022 11:04 AM EDT ----- Regarding: RE: Codes and meanings These are all on the web  22612 - posterior arthrodesis (fusion) for first level in lumbar spine 02233 - 2nd level fusion 22842 - instrumentation code 20930 - nonstructural bone graft 61224 - stereotaxis (image guidance) for placement of spine implants ----- Message ----- From: Sharlot Gowda, RN Sent: 04/15/2022  10:57 AM EDT To: Venetia Night, MD Subject: FW: Codes and meanings                         Can you list the description next to each code per the pt request? Thanks  ----- Message ----- From: Autumn Messing Sent: 04/15/2022   9:54 AM EDT To: Aretha Parrot, CMA; Sharlot Gowda, RN; # Subject: Codes and meanings                             Patient called to follow up on call with Patty yesterday. Requesting call back to define the following codes he was given:  22612 22614 [id number].  Please advise at [phone].

## 2022-05-22 ENCOUNTER — Telehealth: Payer: Self-pay

## 2022-05-22 DIAGNOSIS — Z713 Dietary counseling and surveillance: Secondary | ICD-10-CM

## 2022-05-22 NOTE — Telephone Encounter (Signed)
After reviewing this, Danny Chandler is the one that prescribed him Gabapentin and Robaxin. He should call there office for any medications. Dr. Myer Haff will not prescribe pain medication.

## 2022-05-22 NOTE — Telephone Encounter (Signed)
Patient said that he is willing to try the weight loss clinic. But is there a stronger medication that cn be prescribed. He is not sure exactly what he is taking right now but it is not helping at all with the pain. He uses [facility].

## 2022-05-22 NOTE — Telephone Encounter (Signed)
-----   Message from Rockey Situ sent at 05/22/2022 10:29 AM EDT ----- Regarding: pt not better Contact: [phone] Patient is calling that he is currently at 317, per Dr.Yarbrough's note his goal is 270. He is not able to do exercises to continue to lose weight due to increased pain. He was discharged from PT. He would like an appt with Dr.Yarbrough to discuss further treatment.

## 2022-05-23 NOTE — Telephone Encounter (Signed)
Patient notified and agreed.  

## 2022-05-23 NOTE — Telephone Encounter (Signed)
Yes we will place the order.

## 2022-05-23 NOTE — Telephone Encounter (Signed)
Before I call the patient about the medication. Will a referral to the weight loss clinic be placed?

## 2022-05-26 NOTE — Telephone Encounter (Signed)
Yes I am going to I just need to ask Kendelyn something about this.

## 2022-05-26 NOTE — Telephone Encounter (Signed)
Can you place weight loss clinic referral please.

## 2022-05-28 DIAGNOSIS — Z0289 Encounter for other administrative examinations: Secondary | ICD-10-CM

## 2022-05-28 NOTE — Telephone Encounter (Signed)
Referral faxed to [facility].

## 2022-05-28 NOTE — Addendum Note (Signed)
Addended by: Ernie Hew on: 05/28/2022 09:49 AM   Modules accepted: Orders

## 2022-05-28 NOTE — Telephone Encounter (Signed)
I have placed a order for weight loss. I have put in the comments where to send this.

## 2022-06-04 ENCOUNTER — Encounter (INDEPENDENT_AMBULATORY_CARE_PROVIDER_SITE_OTHER): Payer: Self-pay | Admitting: Family Medicine

## 2022-06-04 ENCOUNTER — Ambulatory Visit (INDEPENDENT_AMBULATORY_CARE_PROVIDER_SITE_OTHER): Payer: Self-pay | Admitting: Family Medicine

## 2022-06-04 VITALS — BP 130/92 | HR 77 | Temp 97.9°F | Ht 72.0 in | Wt 314.0 lb

## 2022-06-04 DIAGNOSIS — E669 Obesity, unspecified: Secondary | ICD-10-CM

## 2022-06-04 DIAGNOSIS — R5383 Other fatigue: Secondary | ICD-10-CM

## 2022-06-04 DIAGNOSIS — K760 Fatty (change of) liver, not elsewhere classified: Secondary | ICD-10-CM

## 2022-06-04 DIAGNOSIS — F3289 Other specified depressive episodes: Secondary | ICD-10-CM

## 2022-06-04 DIAGNOSIS — Z6841 Body Mass Index (BMI) 40.0 and over, adult: Secondary | ICD-10-CM

## 2022-06-04 DIAGNOSIS — E66813 Obesity, class 3: Secondary | ICD-10-CM

## 2022-06-04 DIAGNOSIS — R0602 Shortness of breath: Secondary | ICD-10-CM

## 2022-06-04 DIAGNOSIS — M549 Dorsalgia, unspecified: Secondary | ICD-10-CM

## 2022-06-04 DIAGNOSIS — G8929 Other chronic pain: Secondary | ICD-10-CM

## 2022-06-05 DIAGNOSIS — R5383 Other fatigue: Secondary | ICD-10-CM | POA: Insufficient documentation

## 2022-06-05 DIAGNOSIS — G8929 Other chronic pain: Secondary | ICD-10-CM | POA: Insufficient documentation

## 2022-06-05 DIAGNOSIS — K760 Fatty (change of) liver, not elsewhere classified: Secondary | ICD-10-CM | POA: Insufficient documentation

## 2022-06-05 DIAGNOSIS — F32A Depression, unspecified: Secondary | ICD-10-CM | POA: Insufficient documentation

## 2022-06-05 DIAGNOSIS — R0602 Shortness of breath: Secondary | ICD-10-CM | POA: Insufficient documentation

## 2022-06-05 LAB — COMPREHENSIVE METABOLIC PANEL
ALT: 74 IU/L — ABNORMAL HIGH (ref 0–44)
AST: 74 IU/L — ABNORMAL HIGH (ref 0–40)
Albumin/Globulin Ratio: 1.5 (ref 1.2–2.2)
Albumin: 4.5 g/dL (ref 4.1–5.1)
Alkaline Phosphatase: 51 IU/L (ref 44–121)
BUN/Creatinine Ratio: 12 (ref 9–20)
BUN: 10 mg/dL (ref 6–24)
Bilirubin Total: 0.5 mg/dL (ref 0.0–1.2)
CO2: 20 mmol/L (ref 20–29)
Calcium: 9.3 mg/dL (ref 8.7–10.2)
Chloride: 100 mmol/L (ref 96–106)
Creatinine, Ser: 0.82 mg/dL (ref 0.76–1.27)
Globulin, Total: 3.1 g/dL (ref 1.5–4.5)
Glucose: 109 mg/dL — ABNORMAL HIGH (ref 70–99)
Potassium: 4.6 mmol/L (ref 3.5–5.2)
Sodium: 136 mmol/L (ref 134–144)
Total Protein: 7.6 g/dL (ref 6.0–8.5)
eGFR: 110 mL/min/{1.73_m2} (ref >59)

## 2022-06-05 LAB — CBC WITH DIFFERENTIAL/PLATELET
Basophils Absolute: 0 10*3/uL (ref 0.0–0.2)
Basos: 1 %
EOS (ABSOLUTE): 0.2 10*3/uL (ref 0.0–0.4)
Eos: 2 %
Hematocrit: 46.3 % (ref 37.5–51.0)
Hemoglobin: 15.3 g/dL (ref 13.0–17.7)
Immature Grans (Abs): 0 10*3/uL (ref 0.0–0.1)
Immature Granulocytes: 0 %
Lymphocytes Absolute: 3.2 10*3/uL — ABNORMAL HIGH (ref 0.7–3.1)
Lymphs: 44 %
MCH: 30.4 pg (ref 26.6–33.0)
MCHC: 33 g/dL (ref 31.5–35.7)
MCV: 92 fL (ref 79–97)
Monocytes Absolute: 0.8 10*3/uL (ref 0.1–0.9)
Monocytes: 11 %
Neutrophils Absolute: 3 10*3/uL (ref 1.4–7.0)
Neutrophils: 42 %
Platelets: 351 10*3/uL (ref 150–450)
RBC: 5.03 x10E6/uL (ref 4.14–5.80)
RDW: 13.3 % (ref 11.6–15.4)
WBC: 7.2 10*3/uL (ref 3.4–10.8)

## 2022-06-05 LAB — VITAMIN D 25 HYDROXY (VIT D DEFICIENCY, FRACTURES): Vit D, 25-Hydroxy: 27.5 ng/mL — ABNORMAL LOW (ref 30.0–100.0)

## 2022-06-05 LAB — LIPID PANEL
Chol/HDL Ratio: 5.8 ratio — ABNORMAL HIGH (ref 0.0–5.0)
Cholesterol, Total: 197 mg/dL (ref 100–199)
HDL: 34 mg/dL — ABNORMAL LOW (ref >39)
LDL Chol Calc (NIH): 138 mg/dL — ABNORMAL HIGH (ref 0–99)
Triglycerides: 139 mg/dL (ref 0–149)
VLDL Cholesterol Cal: 25 mg/dL (ref 5–40)

## 2022-06-05 LAB — INSULIN, RANDOM: INSULIN: 43.7 u[IU]/mL — ABNORMAL HIGH (ref 2.6–24.9)

## 2022-06-05 LAB — HEMOGLOBIN A1C
Est. average glucose Bld gHb Est-mCnc: 148 mg/dL
Hgb A1c MFr Bld: 6.8 % — ABNORMAL HIGH (ref 4.8–5.6)

## 2022-06-05 LAB — VITAMIN B12: Vitamin B-12: 460 pg/mL (ref 232–1245)

## 2022-06-05 LAB — TESTOSTERONE: Testosterone: 278 ng/dL (ref 264–916)

## 2022-06-05 LAB — T4, FREE: Free T4: 1.04 ng/dL (ref 0.82–1.77)

## 2022-06-05 LAB — TSH: TSH: 2.98 u[IU]/mL (ref 0.450–4.500)
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
## 2022-06-16 ENCOUNTER — Ambulatory Visit (INDEPENDENT_AMBULATORY_CARE_PROVIDER_SITE_OTHER): Payer: Self-pay | Admitting: Family Medicine

## 2022-06-16 ENCOUNTER — Encounter (INDEPENDENT_AMBULATORY_CARE_PROVIDER_SITE_OTHER): Payer: Self-pay | Admitting: Family Medicine

## 2022-06-16 VITALS — BP 126/89 | HR 74 | Temp 97.9°F | Ht 72.0 in | Wt 311.0 lb

## 2022-06-16 DIAGNOSIS — Z6841 Body Mass Index (BMI) 40.0 and over, adult: Secondary | ICD-10-CM

## 2022-06-16 DIAGNOSIS — E538 Deficiency of other specified B group vitamins: Secondary | ICD-10-CM

## 2022-06-16 DIAGNOSIS — E7849 Other hyperlipidemia: Secondary | ICD-10-CM

## 2022-06-16 DIAGNOSIS — K760 Fatty (change of) liver, not elsewhere classified: Secondary | ICD-10-CM

## 2022-06-16 DIAGNOSIS — E559 Vitamin D deficiency, unspecified: Secondary | ICD-10-CM

## 2022-06-16 DIAGNOSIS — Z7985 Long-term (current) use of injectable non-insulin antidiabetic drugs: Secondary | ICD-10-CM

## 2022-06-16 DIAGNOSIS — E669 Obesity, unspecified: Secondary | ICD-10-CM

## 2022-06-16 DIAGNOSIS — E119 Type 2 diabetes mellitus without complications: Secondary | ICD-10-CM

## 2022-06-16 MED ORDER — VITAMIN D (ERGOCALCIFEROL) 1.25 MG (50000 UNIT) PO CAPS: 50000.0000 [IU] | ORAL_CAPSULE | ORAL | 0 refills | Status: DC

## 2022-06-16 MED ORDER — TIRZEPATIDE 2.5 MG/0.5ML ~~LOC~~ SOAJ: 2.5000 mg | SUBCUTANEOUS | 0 refills | Status: AC

## 2022-06-16 MED ORDER — CYANOCOBALAMIN 1000 MCG/ML IJ SOLN: 1000.0000 ug | INTRAMUSCULAR | 0 refills | Status: DC

## 2022-06-18 NOTE — Progress Notes (Signed)
Chief Complaint:   OBESITY Danny Chandler is here to discuss his progress with his obesity treatment plan along with follow-up of his obesity related diagnoses. Danny Chandler is on the Category 4 Plan and states he is following his eating plan approximately 90% of the time. Danny Chandler states he is not exercising.  Today's visit was #: 2 Starting weight: 314 lbs Starting date: 06/04/2022 Today's weight: 311 lbs Today's date: 06/16/2022 Total lbs lost to date: 3 lbs Total lbs lost since last in-office visit: 3 lbs  Interim History: Changed over to body armour lyte.  Feeling full and sometimes skips dinner.  Denies cravings.  Having limited fruit and veggies.  Plans to start riding indoor bike.  Wife is supportive.   Subjective:   1. Type 2 diabetes mellitus without complication, without long-term current use of insulin ([place]) New diagnosis. Discussed labs with patient today.  A1c 6.8 with no history of prediabetes or DM2.  Started reducing intake of added sugar.    2. Vitamin D deficiency Discussed labs with patient today. Danny Chandler is on prescription Vitamin D 27.7.  He has been taking it 2 times per week with PCP.  Energy level fair.   3. B12 deficiency Discussed labs with patient today. B-12 injection once a month.  Due for refill.  Sees Dr Sabra Heck on Thursday.   4. Other hyperlipidemia Discussed labs with patient today. HDL 34, LDL 138, LDL goal <100. Danny Chandler has never been on lipid lowering medications.   5. NAFL (nonalcoholic fatty liver) Discussed labs with patient today. AST, ALT elevated on labs with known hepatic steatosis.   Assessment/Plan:   1. Type 2 diabetes mellitus without complication, without long-term current use of insulin ([place]) Patient denies a personal or family history of pancreatitis, medullary thyroid carcinoma or multiple endocrine neoplasia type II. Recommend reviewing pen training video online.   Begin- tirzepatide [facility]) 2.5 MG/0.5ML Pen; Inject 2.5 mg into  the skin once a week.  Dispense: 2 mL; Refill: 0  2. Vitamin D deficiency Refill - Vitamin D, Ergocalciferol, (DRISDOL) 1.25 MG (50000 UNIT) CAPS capsule; Take 1 capsule (50,000 Units total) by mouth every 7 (seven) days.  Dispense: 5 capsule; Refill: 0  3. B12 deficiency Refill - cyanocobalamin (VITAMIN B12) 1000 MCG/ML injection; Inject 1 mL (1,000 mcg total) into the muscle every 30 (thirty) days.  Dispense: 10 mL; Refill: 0  4. Other hyperlipidemia Recheck FLP om 6 months with diet and exercise changes.   5. NAFL (nonalcoholic fatty liver) Continue action plan for weight loss.   6. Obesity, current BMI 42.2 1) Do not skip dinner.  2) Reviewed low sugar greek yogurt brands.   Danny Chandler is currently in the action stage of change. As such, his goal is to continue with weight loss efforts. He has agreed to the Category 4 Plan.   Exercise goals:  Add exercise bike 3 times per week.   Behavioral modification strategies: increasing lean protein intake, increasing water intake, decreasing liquid calories, decreasing eating out, no skipping meals, meal planning and cooking strategies, keeping healthy foods in the home, better snacking choices, celebration eating strategies, and decreasing junk food.  Danny Chandler has agreed to follow-up with our clinic in 3 weeks. He was informed of the importance of frequent follow-up visits to maximize his success with intensive lifestyle modifications for his multiple health conditions.   Objective:   Blood pressure 126/89, pulse 74, temperature 97.9 F (36.6 C), height 6' (1.829 m), weight (!) 311 lb (141.1 kg), SpO2  94 %. Body mass index is 42.18 kg/m.  General: Cooperative, alert, well developed, in no acute distress. HEENT: Conjunctivae and lids unremarkable. Cardiovascular: Regular rhythm.  Lungs: Normal work of breathing. Neurologic: No focal deficits.   Lab Results  Component Value Date   CREATININE 0.82 06/04/2022   BUN 10 06/04/2022   NA 136  06/04/2022   K 4.6 06/04/2022   CL 100 06/04/2022   CO2 20 06/04/2022   Lab Results  Component Value Date   ALT 74 (H) 06/04/2022   AST 74 (H) 06/04/2022   ALKPHOS 51 06/04/2022   BILITOT 0.5 06/04/2022   Lab Results  Component Value Date   HGBA1C 6.8 (H) 06/04/2022   Lab Results  Component Value Date   INSULIN 43.7 (H) 06/04/2022   Lab Results  Component Value Date   TSH 2.980 06/04/2022   Lab Results  Component Value Date   CHOL 197 06/04/2022   HDL 34 (L) 06/04/2022   LDLCALC 138 (H) 06/04/2022   TRIG 139 06/04/2022   CHOLHDL 5.8 (H) 06/04/2022   Lab Results  Component Value Date   VD25OH 27.5 (L) 06/04/2022   Lab Results  Component Value Date   WBC 7.2 06/04/2022   HGB 15.3 06/04/2022   HCT 46.3 06/04/2022   MCV 92 06/04/2022   PLT 351 06/04/2022   No results found for: "IRON", "TIBC", "FERRITIN"  Attestation Statements:   Reviewed by clinician on day of visit: allergies, medications, problem list, medical history, surgical history, family history, social history, and previous encounter notes.  I, Davy Pique, am acting as Location manager for Loyal Gambler, DO.  I have reviewed the above documentation for accuracy and completeness, and I agree with the above. Dell Ponto, DO

## 2022-07-15 ENCOUNTER — Telehealth (INDEPENDENT_AMBULATORY_CARE_PROVIDER_SITE_OTHER): Payer: Self-pay | Admitting: Family Medicine

## 2022-07-15 ENCOUNTER — Encounter (INDEPENDENT_AMBULATORY_CARE_PROVIDER_SITE_OTHER): Payer: Self-pay | Admitting: Family Medicine

## 2022-07-15 ENCOUNTER — Ambulatory Visit (INDEPENDENT_AMBULATORY_CARE_PROVIDER_SITE_OTHER): Payer: Self-pay | Admitting: Family Medicine

## 2022-07-15 VITALS — BP 133/85 | HR 94 | Temp 97.6°F | Ht 72.0 in | Wt 312.0 lb

## 2022-07-15 DIAGNOSIS — E66813 Obesity, class 3: Secondary | ICD-10-CM

## 2022-07-15 DIAGNOSIS — E7849 Other hyperlipidemia: Secondary | ICD-10-CM

## 2022-07-15 DIAGNOSIS — Z6841 Body Mass Index (BMI) 40.0 and over, adult: Secondary | ICD-10-CM

## 2022-07-15 DIAGNOSIS — E1169 Type 2 diabetes mellitus with other specified complication: Secondary | ICD-10-CM

## 2022-07-15 DIAGNOSIS — E538 Deficiency of other specified B group vitamins: Secondary | ICD-10-CM

## 2022-07-15 DIAGNOSIS — E669 Obesity, unspecified: Secondary | ICD-10-CM

## 2022-07-15 DIAGNOSIS — Z7984 Long term (current) use of oral hypoglycemic drugs: Secondary | ICD-10-CM

## 2022-07-15 DIAGNOSIS — E559 Vitamin D deficiency, unspecified: Secondary | ICD-10-CM

## 2022-07-15 MED ORDER — VITAMIN D (ERGOCALCIFEROL) 1.25 MG (50000 UNIT) PO CAPS: 50000.0000 [IU] | ORAL_CAPSULE | ORAL | 0 refills | Status: AC

## 2022-07-15 MED ORDER — CYANOCOBALAMIN 1000 MCG/ML IJ SOLN: 1000.0000 ug | INTRAMUSCULAR | 0 refills | Status: AC

## 2022-07-15 NOTE — Telephone Encounter (Signed)
Left patient VM to update filing coverage for DOS 07/15/22/ Need to inform Ardeen Fillers once ins has been added to DOS to process a PA

## 2022-07-23 NOTE — Progress Notes (Signed)
Chief Complaint:   OBESITY Danny Chandler is here to discuss his progress with his obesity treatment plan along with follow-up of his obesity related diagnoses. [name] is on the Category 4 Plan and states he is following his eating plan approximately 50% of the time. [name] states he is not exercising.   Today's visit was #: 3 Starting weight: 314 lbs Starting date: 06/04/2022 Today's weight: 312 lbs Today's date: 07/15/2022 Total lbs lost to date: 2 lbs Total lbs lost since last in-office visit: +1 lb  Interim History: Camped this weekend with son.  Doing fairly well with meal plan.  Getting in fruits and veggies.  Eating out some fast food.  Drinking some regular Sprite.  Trying out more sugar free drinks.   Subjective:   1. Vitamin D deficiency He is currently taking prescription vitamin D 50,000 IU each week. He denies nausea, vomiting or muscle weakness. Last Vitamin D level 27.7.   2. Type 2 diabetes mellitus with other specified complication, unspecified whether long term insulin use ([facility]) Last A1c 6.8.  Started on metformin 500 mg BID (has not picked up yet).  Has not yet started Mounjaro 2.5 mg, awaiting PA.    3. Other hyperlipidemia LDL at goal <150.  Last LDL <138.  She is not on any lipid lowering medications.  She is still consuming fried food several times per week.   4. B12 deficiency She is on prescription B-12 injections 1,000 mcg every month, but has not received from pharmacy.    Assessment/Plan:   1. Vitamin D deficiency Refill - Vitamin D, Ergocalciferol, (DRISDOL) 1.25 MG (50000 UNIT) CAPS capsule; Take 1 capsule (50,000 Units total) by mouth every 7 (seven) days.  Dispense: 5 capsule; Refill: 0  2. Type 2 diabetes mellitus with other specified complication, unspecified whether long term insulin use (Danny Chandler) Reviewed [email or web address] for more information, pen training, savings card.    - metFORMIN (GLUCOPHAGE) 500 MG tablet; Take by mouth.  3. Other  hyperlipidemia Follow up with PCP, plan to recheck FLP in 4-6 months.  Start statin if LDL >100.  4. B12 deficiency Refill - cyanocobalamin (VITAMIN B12) 1000 MCG/ML injection; Inject 1 mL (1,000 mcg total) into the muscle every 30 (thirty) days.  Dispense: 10 mL; Refill: 0  5. Obesity,current BMI 42.3 1) Reduce frequency of fast food to 1 time per week.  2) Eating out guide given. 3) Cut out regular soda.   Danny Chandler is currently in the action stage of change. As such, his goal is to continue with weight loss efforts. He has agreed to the Category 4 Plan.   Exercise goals: All adults should avoid inactivity. Some physical activity is better than none, and adults who participate in any amount of physical activity gain some health benefits.  Behavioral modification strategies: increasing lean protein intake, increasing vegetables, increasing water intake, decreasing liquid calories, decreasing alcohol intake, decreasing eating out, no skipping meals, meal planning and cooking strategies, keeping healthy foods in the home, better snacking choices, and decreasing junk food.  Danny Chandler has agreed to follow-up with our clinic in 4 weeks. He was informed of the importance of frequent follow-up visits to maximize his success with intensive lifestyle modifications for his multiple health conditions.   Objective:   Blood pressure 133/85, pulse 94, temperature 97.6 F (36.4 C), height 6' (1.829 m), weight (!) 312 lb (141.5 kg), SpO2 95 %. Body mass index is 42.31 kg/m.  General: Cooperative, alert, well developed, in no acute distress.  HEENT: Conjunctivae and lids unremarkable. Cardiovascular: Regular rhythm.  Lungs: Normal work of breathing. Neurologic: No focal deficits.   Lab Results  Component Value Date   CREATININE 0.82 06/04/2022   BUN 10 06/04/2022   NA 136 06/04/2022   K 4.6 06/04/2022   CL 100 06/04/2022   CO2 20 06/04/2022   Lab Results  Component Value Date   ALT 74 (H)  06/04/2022   AST 74 (H) 06/04/2022   ALKPHOS 51 06/04/2022   BILITOT 0.5 06/04/2022   Lab Results  Component Value Date   HGBA1C 6.8 (H) 06/04/2022   Lab Results  Component Value Date   INSULIN 43.7 (H) 06/04/2022   Lab Results  Component Value Date   TSH 2.980 06/04/2022   Lab Results  Component Value Date   CHOL 197 06/04/2022   HDL 34 (L) 06/04/2022   LDLCALC 138 (H) 06/04/2022   TRIG 139 06/04/2022   CHOLHDL 5.8 (H) 06/04/2022   Lab Results  Component Value Date   VD25OH 27.5 (L) 06/04/2022   Lab Results  Component Value Date   WBC 7.2 06/04/2022   HGB 15.3 06/04/2022   HCT 46.3 06/04/2022   MCV 92 06/04/2022   PLT 351 06/04/2022   No results found for: "IRON", "TIBC", "FERRITIN"  Attestation Statements:   Reviewed by clinician on day of visit: allergies, medications, problem list, medical history, surgical history, family history, social history, and previous encounter notes.  I, Davy Pique, am acting as Location manager for Loyal Gambler, DO.  I have reviewed the above documentation for accuracy and completeness, and I agree with the above. Dell Ponto, DO

## 2022-08-12 ENCOUNTER — Ambulatory Visit (INDEPENDENT_AMBULATORY_CARE_PROVIDER_SITE_OTHER): Payer: Self-pay | Admitting: Family Medicine

## 2023-04-08 ENCOUNTER — Encounter: Payer: Self-pay | Admitting: Dermatology

## 2023-04-08 ENCOUNTER — Ambulatory Visit (INDEPENDENT_AMBULATORY_CARE_PROVIDER_SITE_OTHER): Payer: Self-pay | Admitting: Dermatology

## 2023-04-08 VITALS — BP 131/84 | HR 89

## 2023-04-08 DIAGNOSIS — Z79899 Other long term (current) drug therapy: Secondary | ICD-10-CM

## 2023-04-08 DIAGNOSIS — L409 Psoriasis, unspecified: Secondary | ICD-10-CM

## 2023-04-08 DIAGNOSIS — Z7189 Other specified counseling: Secondary | ICD-10-CM

## 2023-04-08 NOTE — Progress Notes (Signed)
   Follow-Up Visit   Subjective  Danny Chandler is a 47 y.o. male who presents for the following: Psoriasis Hands,feet, and below knees and some on chest  Patient was on skyrizi, patient has not been on medication since last July 2023 due to losing his insurance.  He would like to get Skyrizi as he cleared completely with this treatment.  He has no history of joint aches or psoriatic arthritis.  The following portions of the chart were reviewed this encounter and updated as appropriate: medications, allergies, medical history  Review of Systems:  No other skin or systemic complaints except as noted in HPI or Assessment and Plan.  Objective  Well appearing patient in no apparent distress; mood and affect are within normal limits.  Areas Examined: Arms, legs, chest,   Relevant exam findings are noted in the Assessment and Plan.                                 Assessment & Plan   Psoriasis  Related Procedures Comprehensive metabolic panel CBC with Differential/Platelet Hepatitis B surface antibody,qualitative Hepatitis B surface antibody,quantitative Hepatitis B surface antigen Hepatitis B core antibody, total Hepatitis C antibody QuantiFERON-TB Gold Plus  PSORIASIS Psoriasis -severe  No symptoms or signs of psoriatic arthritis B/l Hands, B/l legs, Right chest Photos today  Exam: Well-demarcated erythematous papules/plaques with silvery scale  See photos above Chronic and persistent condition with duration or expected duration over one year. Condition is bothersome/symptomatic for patient. Currently flared, since being off medication  Treatment Plan: Has tried and failed cimzia Patient was on skyrizi and was clear until he lost benefits.   Counseling on psoriasis and coordination of care  psoriasis is a chronic non-curable, but treatable genetic/hereditary disease that may have other systemic features affecting other organ systems such as  joints (Psoriatic Arthritis). It is associated with an increased risk of inflammatory bowel disease, heart disease, non-alcoholic fatty liver disease, and depression.  Treatments include light and laser treatments; topical medications; and systemic medications including oral and injectables.  Psoriasis is a chronic non-curable, but treatable genetic/hereditary disease that may have other systemic features affecting other organ systems such as joints (Psoriatic Arthritis). It is associated with an increased risk of inflammatory bowel disease, heart disease, non-alcoholic fatty liver disease, and depression.     Has no risk factors of hiv and therefore declines HIV testing. Plan to start Skyrizi pending normal labs and abbvie patient assistance approval  [facility] Lab orders given today  Has no risk factors of hiv   If not approved for patient assistance for Skyrizi, may consider topical treatments   Return for 2 - 3 month psoriasis .  IAsher Muir, CMA, am acting as scribe for Armida Sans, MD.  Documentation: I have reviewed the above documentation for accuracy and completeness, and I agree with the above.  Armida Sans, MD

## 2023-04-08 NOTE — Patient Instructions (Addendum)
[facility] to help with income     Due to recent changes in healthcare laws, you may see results of your pathology and/or laboratory studies on MyChart before the doctors have had a chance to review them. We understand that in some cases there may be results that are confusing or concerning to you. Please understand that not all results are received at the same time and often the doctors may need to interpret multiple results in order to provide you with the best plan of care or course of treatment. Therefore, we ask that you please give [place] 2 business days to thoroughly review all your results before contacting the office for clarification. Should we see a critical lab result, you will be contacted sooner.   If You Need Anything After Your Visit  If you have any questions or concerns for your doctor, please call our main line at [phone] and press option 4 to reach your doctor's medical assistant. If no one answers, please leave a voicemail as directed and we will return your call as soon as possible. Messages left after 4 pm will be answered the following business day.   You may also send [place] a message via MyChart. We typically respond to MyChart messages within 1-2 business days.  For prescription refills, please ask your pharmacy to contact our office. Our fax number is [phone].  If you have an urgent issue when the clinic is closed that cannot wait until the next business day, you can page your doctor at the number below.    Please note that while we do our best to be available for urgent issues outside of office hours, we are not available 24/7.   If you have an urgent issue and are unable to reach [place], you may choose to seek medical care at your doctor's office, retail clinic, urgent care center, or emergency room.  If you have a medical emergency, please immediately call 911 or go to the emergency department.  Pager Numbers  - Dr. Gwen Pounds: [phone]  -  Dr. Neale Burly: [phone]  - Dr. Roseanne Reno: [phone]  In the event of inclement weather, please call our main line at [phone] for an update on the status of any delays or closures.  Dermatology Medication Tips: Please keep the boxes that topical medications come in in order to help keep track of the instructions about where and how to use these. Pharmacies typically print the medication instructions only on the boxes and not directly on the medication tubes.   If your medication is too expensive, please contact our office at [phone] option 4 or send [place] a message through MyChart.   We are unable to tell what your co-pay for medications will be in advance as this is different depending on your insurance coverage. However, we may be able to find a substitute medication at lower cost or fill out paperwork to get insurance to cover a needed medication.   If a prior authorization is required to get your medication covered by your insurance company, please allow [place] 1-2 business days to complete this process.  Drug prices often vary depending on where the prescription is filled and some pharmacies may offer cheaper prices.  The website www.goodrx.com contains coupons for medications through different pharmacies. The prices here do not account for what the cost may be with help from insurance (it may be cheaper with your insurance), but the website can give you the price if you did not use any insurance.  -  You can print the associated coupon and take it with your prescription to the pharmacy.  - You may also stop by our office during regular business hours and pick up a GoodRx coupon card.  - If you need your prescription sent electronically to a different pharmacy, notify our office through [facility] or by phone at [phone] option 4.     Si Usted Necesita Algo Despus de Su Visita  Tambin puede enviarnos un mensaje a travs de Clinical cytogeneticist. Por lo general respondemos a los  mensajes de MyChart en el transcurso de 1 a 2 das hbiles.  Para renovar recetas, por favor pida a su farmacia que se ponga en contacto con nuestra oficina. Annie Sable de fax es [place] [phone].  Si tiene un asunto urgente cuando la clnica est cerrada y que no puede esperar hasta el siguiente da hbil, puede llamar/localizar a su doctor(a) al nmero que aparece a continuacin.   Por favor, tenga en cuenta que aunque hacemos todo lo posible para estar disponibles para asuntos urgentes fuera del horario de [place], no estamos disponibles las 24 horas del da, los 7 [address]  [address] de la [place].   Si tiene un problema urgente y no puede comunicarse con nosotros, puede optar por buscar atencin mdica  en el consultorio de su doctor(a), en una clnica privada, en un centro de atencin urgente o en una sala de emergencias.  Si tiene Engineer, drilling, por favor llame inmediatamente al 911 o vaya a la sala de emergencias.  Nmeros de bper  - Dr. Gwen Pounds: [phone]  - Dra. Moye: [phone]  - Dra. Roseanne Reno: [phone]  En caso de inclemencias del [place], por favor llame a Lacy Duverney principal al [phone] para una actualizacin sobre el [place] de cualquier retraso o cierre.  Consejos para la medicacin en dermatologa: Por favor, guarde las cajas en las que vienen los medicamentos de uso tpico para ayudarle a seguir las instrucciones sobre dnde y cmo usarlos. Las farmacias generalmente imprimen las instrucciones del medicamento slo en las cajas y no directamente en los tubos del [place].   Si su medicamento es muy caro, por favor, pngase en contacto con Rolm Gala llamando al [phone] y presione la opcin 4 o envenos un mensaje a travs de Clinical cytogeneticist.   No podemos decirle cul ser su copago por los medicamentos por adelantado ya que esto es diferente dependiendo de la cobertura de su seguro. Sin embargo, es posible que podamos encontrar un medicamento sustituto a  Audiological scientist un formulario para que el seguro cubra el medicamento que se considera necesario.   Si se requiere una autorizacin previa para que su compaa de seguros [place] su medicamento, por favor permtanos de 1 a 2 das hbiles para completar [address]  Los precios de los medicamentos varan con frecuencia dependiendo del Environmental consultant de dnde se surte la receta y alguna farmacias pueden ofrecer precios ms baratos.  El sitio web www.goodrx.com tiene cupones para medicamentos de Health and safety inspector. Los precios aqu no tienen en cuenta lo que podra costar con la ayuda del seguro (puede ser ms barato con su seguro), pero el sitio web puede darle el precio si no utiliz Tourist information centre manager.  - Puede imprimir el cupn correspondiente y llevarlo con su receta a la farmacia.  - Tambin puede pasar por nuestra oficina durante el horario de atencin regular y Education officer, museum una tarjeta de cupones de GoodRx.  - Si necesita que su receta se enve electrnicamente a una farmacia diferente, informe  a nuestra oficina a travs de MyChart de [place] o por telfono llamando al [phone] y presione la opcin 4.

## 2023-04-10 ENCOUNTER — Encounter: Payer: Self-pay | Admitting: Dermatology

## 2023-04-15 LAB — CBC WITH DIFFERENTIAL/PLATELET
Basophils Absolute: 0.1 10*3/uL (ref 0.0–0.2)
Basos: 1 %
EOS (ABSOLUTE): 0.2 10*3/uL (ref 0.0–0.4)
Eos: 2 %
Hematocrit: 42.9 % (ref 37.5–51.0)
Hemoglobin: 14.7 g/dL (ref 13.0–17.7)
Immature Grans (Abs): 0 10*3/uL (ref 0.0–0.1)
Immature Granulocytes: 0 %
Lymphocytes Absolute: 3.3 10*3/uL — ABNORMAL HIGH (ref 0.7–3.1)
Lymphs: 38 %
MCH: 30.9 pg (ref 26.6–33.0)
MCHC: 34.3 g/dL (ref 31.5–35.7)
MCV: 90 fL (ref 79–97)
Monocytes Absolute: 0.9 10*3/uL (ref 0.1–0.9)
Monocytes: 11 %
Neutrophils Absolute: 4.2 10*3/uL (ref 1.4–7.0)
Neutrophils: 48 %
Platelets: 336 10*3/uL (ref 150–450)
RBC: 4.75 x10E6/uL (ref 4.14–5.80)
RDW: 13.2 % (ref 11.6–15.4)
WBC: 8.7 10*3/uL (ref 3.4–10.8)

## 2023-04-15 LAB — COMPREHENSIVE METABOLIC PANEL
ALT: 43 IU/L (ref 0–44)
AST: 43 IU/L — ABNORMAL HIGH (ref 0–40)
Albumin: 4.3 g/dL (ref 4.1–5.1)
Alkaline Phosphatase: 54 IU/L (ref 44–121)
BUN/Creatinine Ratio: 12 (ref 9–20)
BUN: 10 mg/dL (ref 6–24)
Bilirubin Total: 0.4 mg/dL (ref 0.0–1.2)
CO2: 23 mmol/L (ref 20–29)
Calcium: 10.4 mg/dL — ABNORMAL HIGH (ref 8.7–10.2)
Chloride: 101 mmol/L (ref 96–106)
Creatinine, Ser: 0.85 mg/dL (ref 0.76–1.27)
Globulin, Total: 3 g/dL (ref 1.5–4.5)
Glucose: 120 mg/dL — ABNORMAL HIGH (ref 70–99)
Potassium: 4.6 mmol/L (ref 3.5–5.2)
Sodium: 138 mmol/L (ref 134–144)
Total Protein: 7.3 g/dL (ref 6.0–8.5)
eGFR: 109 mL/min/{1.73_m2} (ref >59)

## 2023-04-15 LAB — QUANTIFERON-TB GOLD PLUS
QuantiFERON Mitogen Value: >10 IU/mL
QuantiFERON Nil Value: 0.11 IU/mL
QuantiFERON TB1 Ag Value: 0.09 IU/mL
QuantiFERON TB2 Ag Value: 0.1 IU/mL
QuantiFERON-TB Gold Plus: NEGATIVE

## 2023-04-15 LAB — HEPATITIS B SURFACE ANTIBODY,QUALITATIVE

## 2023-04-15 LAB — HEPATITIS B SURFACE ANTIGEN: Hepatitis B Surface Ag: NEGATIVE

## 2023-04-15 LAB — HEPATITIS C ANTIBODY: Hep C Virus Ab: NONREACTIVE

## 2023-04-15 LAB — HEPATITIS B SURFACE ANTIBODY, QUANTITATIVE: Hepatitis B Surf Ab Quant: <3.5 m[IU]/mL — ABNORMAL LOW

## 2023-04-15 LAB — HEPATITIS B CORE ANTIBODY, TOTAL: Hep B Core Total Ab: NEGATIVE

## 2023-04-16 ENCOUNTER — Telehealth: Payer: Self-pay

## 2023-04-16 NOTE — Telephone Encounter (Signed)
Called pt discussed labs normal we will call in [employer]

## 2023-04-16 NOTE — Telephone Encounter (Signed)
-----   Message from Willeen Niece sent at 04/16/2023  1:05 PM EDT ----- CBC/diff nl, CMP okay (blood glucose slightly elevated), Hep panel negative (pt not Hep B immune), TB negative Send in Rx for [facility] as per OV note, will need pt assistance forms filled out if not already completed.   - please call patient

## 2023-04-20 ENCOUNTER — Other Ambulatory Visit: Payer: Self-pay

## 2023-04-20 MED ORDER — SKYRIZI PEN 150 MG/ML ~~LOC~~ SOAJ: 150.0000 mg | SUBCUTANEOUS | 1 refills | Status: AC

## 2023-04-20 MED ORDER — SKYRIZI PEN 150 MG/ML ~~LOC~~ SOAJ: 150.0000 mg | SUBCUTANEOUS | 2 refills | Status: AC

## 2023-05-21 ENCOUNTER — Other Ambulatory Visit: Payer: Self-pay | Admitting: Family

## 2023-05-21 DIAGNOSIS — I872 Venous insufficiency (chronic) (peripheral): Secondary | ICD-10-CM

## 2023-05-25 ENCOUNTER — Ambulatory Visit
Admission: RE | Admit: 2023-05-25 | Discharge: 2023-05-25 | Disposition: A | Discharge status: Home / Self Care | Payer: Self-pay | Source: Ambulatory Visit | Attending: Family | Admitting: Family

## 2023-05-25 DIAGNOSIS — I872 Venous insufficiency (chronic) (peripheral): Secondary | ICD-10-CM | POA: Insufficient documentation

## 2023-07-16 ENCOUNTER — Ambulatory Visit: Payer: Self-pay | Admitting: Dermatology

## 2023-07-21 ENCOUNTER — Encounter: Payer: Self-pay | Admitting: Dermatology

## 2023-07-21 ENCOUNTER — Ambulatory Visit (INDEPENDENT_AMBULATORY_CARE_PROVIDER_SITE_OTHER): Payer: Commercial Managed Care - PPO | Admitting: Dermatology

## 2023-07-21 DIAGNOSIS — L409 Psoriasis, unspecified: Secondary | ICD-10-CM | POA: Diagnosis not present

## 2023-07-21 DIAGNOSIS — Z79899 Other long term (current) drug therapy: Secondary | ICD-10-CM | POA: Diagnosis not present

## 2023-07-21 DIAGNOSIS — Z7189 Other specified counseling: Secondary | ICD-10-CM | POA: Diagnosis not present

## 2023-07-21 MED ORDER — ZORYVE 0.3 % EX CREA: TOPICAL_CREAM | CUTANEOUS | 3 refills | Status: AC

## 2023-07-21 NOTE — Progress Notes (Signed)
   Follow-Up Visit   Subjective  Danny Chandler is a 47 y.o. male who presents for the following: Psoriasis. Was able to get Skyrizi injections. States has had 2 injections since last visit. Next injection due 07/31/2023. Saw improvement after first injection. Now is flaring badly. Hands had cleared but have flared again.  This patient is accompanied in the office by his spouse.   The following portions of the chart were reviewed this encounter and updated as appropriate: medications, allergies, medical history  Review of Systems:  No other skin or systemic complaints except as noted in HPI or Assessment and Plan.  Objective  Well appearing patient in no apparent distress; mood and affect are within normal limits.  Areas Examined: Face, hands, arms, legs, abdomen  Relevant exam findings are noted in the Assessment and Plan.                               Assessment & Plan     PSORIASIS on systemic treatment Well-demarcated erythematous papules/plaques with silvery scale, guttate pink scaly papules. 12% BSA.  Chronic and persistent condition with duration or expected duration over one year. Condition is bothersome/symptomatic for patient. Currently flared.   Counseling and coordination of care for severe psoriasis on systemic treatment  Psoriasis - severe on systemic treatment.  Psoriasis is a chronic non-curable, but treatable genetic/hereditary disease that may have other systemic features affecting other organ systems such as joints (Psoriatic Arthritis).  It is linked with heart disease, inflammatory bowel disease, non-alcoholic fatty liver disease, and depression. Significant skin psoriasis and/or psoriatic arthritis may have significant symptoms and affects activities of daily activity and often benefits from systemic treatments.  These systemic treatments have some potential side effects including immunosuppression and require pre-treatment laboratory  screening and periodic laboratory monitoring and periodic in person evaluation and monitoring by the attending dermatologist physician (long term medication management).   Patient denies joint pain  Treatment Plan: Continue Skyrizi 150 mg every 3 months as directed Consider other treatment options if patient does not improve.  Start Zoryve cream once daily.   Samples of CeraVe Itch Relief, CeraVe Psoriasis Cream and CeraVe SA given today.   Reviewed risks of biologics including immunosuppression, infections, injection site reaction, and failure to improve condition. Goal is control of skin condition, not cure.  Some older biologics such as Humira and Enbrel may slightly increase risk of malignancy and may worsen congestive heart failure.  Taltz and Cosentyx may cause inflammatory bowel disease to flare. The use of biologics requires long term medication management, including periodic office visits and monitoring of blood work.   Long term medication management.  Patient is using long term (months to years) prescription medication  to control their dermatologic condition.  These medications require periodic monitoring to evaluate for efficacy and side effects and may require periodic laboratory monitoring.   Return in about 7 months (around 02/18/2024) for Psoriasis Follow Up.  I, Lawson Radar, CMA, am acting as scribe for Armida Sans, MD.   Documentation: I have reviewed the above documentation for accuracy and completeness, and I agree with the above.  Armida Sans, MD

## 2023-07-21 NOTE — Patient Instructions (Addendum)
Continue Skyrizi 150 mg every 3 months as directed  Start Zoryve cream once daily.   Reviewed risks of biologics including immunosuppression, infections, injection site reaction, and failure to improve condition. Goal is control of skin condition, not cure.  Some older biologics such as Humira and Enbrel may slightly increase risk of malignancy and may worsen congestive heart failure.  Taltz and Cosentyx may cause inflammatory bowel disease to flare. The use of biologics requires long term medication management, including periodic office visits and monitoring of blood work.     Your prescription was sent to [facility] in [place]. A representative from [facility] will contact you within 3 business hours to verify your address and insurance information to schedule a free delivery. If for any reason you do not receive a phone call from them, please reach out to them. Their phone number is [phone] and their hours are Monday-Friday 9:00 am-5:00 pm.        Gentle Skin Care Guide  1. Bathe no more than once a day.  2. Avoid bathing in hot water  3. Use a mild soap like Dove, Vanicream, Cetaphil, CeraVe. Can use Lever 2000 or Cetaphil antibacterial soap  4. Use soap only where you need it. On most days, use it under your arms, between your legs, and on your feet. Let the water rinse other areas unless visibly dirty.  5. When you get out of the bath/shower, use a towel to gently blot your skin dry, don't rub it.  6. While your skin is still a little damp, apply a moisturizing cream such as Vanicream, CeraVe, Cetaphil, Eucerin, Sarna lotion or plain Vaseline Jelly. For hands apply Neutrogena [place] Hand Cream or Excipial Hand Cream.  7. Reapply moisturizer any time you start to itch or feel dry.  8. Sometimes using free and clear laundry detergents can be helpful. Fabric softener sheets should be avoided. Downy Free & Gentle liquid, or any liquid fabric softener that is free  of dyes and perfumes, it acceptable to use  9. If your doctor has given you prescription creams you may apply moisturizers over them      Due to recent changes in healthcare laws, you may see results of your pathology and/or laboratory studies on MyChart before the doctors have had a chance to review them. We understand that in some cases there may be results that are confusing or concerning to you. Please understand that not all results are received at the same time and often the doctors may need to interpret multiple results in order to provide you with the best plan of care or course of treatment. Therefore, we ask that you please give [place] 2 business days to thoroughly review all your results before contacting the office for clarification. Should we see a critical lab result, you will be contacted sooner.   If You Need Anything After Your Visit  If you have any questions or concerns for your doctor, please call our main line at [phone] and press option 4 to reach your doctor's medical assistant. If no one answers, please leave a voicemail as directed and we will return your call as soon as possible. Messages left after 4 pm will be answered the following business day.   You may also send [place] a message via MyChart. We typically respond to MyChart messages within 1-2 business days.  For prescription refills, please ask your pharmacy to contact our office. Our fax number is [phone].  If you have an urgent  issue when the clinic is closed that cannot wait until the next business day, you can page your doctor at the number below.    Please note that while we do our best to be available for urgent issues outside of office hours, we are not available 24/7.   If you have an urgent issue and are unable to reach [place], you may choose to seek medical care at your doctor's office, retail clinic, urgent care center, or emergency room.  If you have a medical emergency, please immediately call 911 or  go to the emergency department.  Pager Numbers  - Dr. Gwen Pounds: [phone]  - Dr. Roseanne Reno: [phone]  - Dr. Katrinka Blazing: [phone]   In the event of inclement weather, please call our main line at [phone] for an update on the status of any delays or closures.  Dermatology Medication Tips: Please keep the boxes that topical medications come in in order to help keep track of the instructions about where and how to use these. Pharmacies typically print the medication instructions only on the boxes and not directly on the medication tubes.   If your medication is too expensive, please contact our office at [phone] option 4 or send [place] a message through MyChart.   We are unable to tell what your co-pay for medications will be in advance as this is different depending on your insurance coverage. However, we may be able to find a substitute medication at lower cost or fill out paperwork to get insurance to cover a needed medication.   If a prior authorization is required to get your medication covered by your insurance company, please allow [place] 1-2 business days to complete this process.  Drug prices often vary depending on where the prescription is filled and some pharmacies may offer cheaper prices.  The website www.goodrx.com contains coupons for medications through different pharmacies. The prices here do not account for what the cost may be with help from insurance (it may be cheaper with your insurance), but the website can give you the price if you did not use any insurance.  - You can print the associated coupon and take it with your prescription to the pharmacy.  - You may also stop by our office during regular business hours and pick up a GoodRx coupon card.  - If you need your prescription sent electronically to a different pharmacy, notify our office through [facility] or by phone at [phone] option 4.     Si Usted Necesita Algo Despus de Su  Visita  Tambin puede enviarnos un mensaje a travs de Clinical cytogeneticist. Por lo general respondemos a los mensajes de MyChart en el transcurso de 1 a 2 das hbiles.  Para renovar recetas, por favor pida a su farmacia que se ponga en contacto con nuestra oficina. Annie Sable de fax es [place] [phone].  Si tiene un asunto urgente cuando la clnica est cerrada y que no puede esperar hasta el siguiente da hbil, puede llamar/localizar a su doctor(a) al nmero que aparece a continuacin.   Por favor, tenga en cuenta que aunque hacemos todo lo posible para estar disponibles para asuntos urgentes fuera del horario de [place], no estamos disponibles las 24 horas del da, los 7 [address]  [address] de la [place].   Si tiene un problema urgente y no puede comunicarse con nosotros, puede optar por buscar atencin mdica  en el consultorio de su doctor(a), en una clnica privada, en un centro de atencin urgente o en una sala de emergencias.  Si tiene Engineer, drilling, por favor llame inmediatamente al 911 o vaya a la sala de emergencias.  Nmeros de bper  - Dr. Gwen Pounds: [phone]  - Dra. Roseanne Reno: [phone]  - Dr. Katrinka Blazing: [phone]   En caso de inclemencias del tiempo, por favor llame a Lacy Duverney principal al [phone] para una actualizacin sobre el [place] de cualquier retraso o cierre.  Consejos para la medicacin en dermatologa: Por favor, guarde las cajas en las que vienen los medicamentos de uso tpico para ayudarle a seguir las instrucciones sobre dnde y cmo usarlos. Las farmacias generalmente imprimen las instrucciones del medicamento slo en las cajas y no directamente en los tubos del [place].   Si su medicamento es muy caro, por favor, pngase en contacto con Rolm Gala llamando al [phone] y presione la opcin 4 o envenos un mensaje a travs de Clinical cytogeneticist.   No podemos decirle cul ser su copago por los medicamentos por adelantado ya que esto es diferente dependiendo de  la cobertura de su seguro. Sin embargo, es posible que podamos encontrar un medicamento sustituto a Audiological scientist un formulario para que el seguro cubra el medicamento que se considera necesario.   Si se requiere una autorizacin previa para que su compaa de seguros [place] su medicamento, por favor permtanos de 1 a 2 das hbiles para completar [address]  Los precios de los medicamentos varan con frecuencia dependiendo del Environmental consultant de dnde se surte la receta y alguna farmacias pueden ofrecer precios ms baratos.  El sitio web www.goodrx.com tiene cupones para medicamentos de Health and safety inspector. Los precios aqu no tienen en cuenta lo que podra costar con la ayuda del seguro (puede ser ms barato con su seguro), pero el sitio web puede darle el precio si no utiliz Tourist information centre manager.  - Puede imprimir el cupn correspondiente y llevarlo con su receta a la farmacia.  - Tambin puede pasar por nuestra oficina durante el horario de atencin regular y Education officer, museum una tarjeta de cupones de GoodRx.  - Si necesita que su receta se enve electrnicamente a una farmacia diferente, informe a nuestra oficina a travs de MyChart de [place] o por telfono llamando al [phone] y presione la opcin 4.

## 2024-02-24 ENCOUNTER — Ambulatory Visit: Payer: Self-pay | Admitting: Dermatology

## 2024-04-05 ENCOUNTER — Ambulatory Visit: Payer: Self-pay | Admitting: Dermatology

## 2024-04-05 ENCOUNTER — Other Ambulatory Visit: Payer: Self-pay

## 2024-04-05 ENCOUNTER — Encounter: Payer: Self-pay | Admitting: Dermatology

## 2024-04-05 DIAGNOSIS — L409 Psoriasis, unspecified: Secondary | ICD-10-CM

## 2024-04-05 DIAGNOSIS — Z79899 Other long term (current) drug therapy: Secondary | ICD-10-CM

## 2024-04-05 DIAGNOSIS — Z7189 Other specified counseling: Secondary | ICD-10-CM

## 2024-04-05 MED ORDER — BIMEKIZUMAB-BKZX 320 MG/2ML ~~LOC~~ SOAJ
320.0000 mg | Freq: Once | SUBCUTANEOUS | Status: AC
  Administered 2024-04-05: 320 mg via SUBCUTANEOUS

## 2024-04-05 MED ORDER — BIMZELX 320 MG/2ML ~~LOC~~ SOAJ: 320.0000 mg | SUBCUTANEOUS | 3 refills | Status: AC

## 2024-04-05 MED ORDER — BIMZELX 320 MG/2ML ~~LOC~~ SOAJ: 320.0000 mg | SUBCUTANEOUS | 1 refills | Status: AC

## 2024-04-05 NOTE — Patient Instructions (Signed)
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
## 2024-04-09 LAB — QUANTIFERON-TB GOLD PLUS
QuantiFERON Mitogen Value: >10 [IU]/mL
QuantiFERON Nil Value: 0.02 [IU]/mL
QuantiFERON TB1 Ag Value: 0.03 [IU]/mL
QuantiFERON TB2 Ag Value: 0.03 [IU]/mL
QuantiFERON-TB Gold Plus: NEGATIVE

## 2024-04-11 ENCOUNTER — Ambulatory Visit: Payer: Self-pay | Admitting: Dermatology

## 2024-04-11 NOTE — Telephone Encounter (Signed)
-----   Message from [name] sent at 04/11/2024  1:16 PM EDT ----- Lab = TB test / Quantiferon Gold = Negative / Normal  Continue Bimzelx  for Psoriasis Keep follow up appt ----- Message ----- From: Interface, [facility] Lab Results In Sent: 04/09/2024   7:36 AM EDT To: [name], MD

## 2024-04-11 NOTE — Telephone Encounter (Signed)
 Left voicemail to return my call

## 2024-04-12 ENCOUNTER — Ambulatory Visit: Payer: Self-pay | Admitting: Dermatology

## 2024-04-12 NOTE — Telephone Encounter (Signed)
 Patient advised labs okay. aw

## 2024-04-12 NOTE — Telephone Encounter (Addendum)
 Tried calling patient. No answer. Lm for patient to return call. ----- Message from [name] sent at 04/11/2024  1:16 PM EDT ----- Lab = TB test / Quantiferon Gold = Negative / Normal  Continue Bimzelx  for Psoriasis Keep follow up appt ----- Message ----- From: Interface, [facility] Lab Results In Sent: 04/09/2024   7:36 AM EDT To: [name], MD

## 2024-04-28 ENCOUNTER — Telehealth: Payer: Self-pay

## 2024-04-28 NOTE — Telephone Encounter (Signed)
 Appeal preparation checklist faxed to [employer].

## 2024-04-28 NOTE — Telephone Encounter (Signed)
 Patient's insurance is denying coverage for Bimzelx  stating patient must try and fail or have documented contraindication to: simlandi, Rinvoq, or Tremfya. 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   Would you be able to state why simlandi, Rinvoq, or Tremfya are not appropriate for patient?  Please advise

## 2024-05-03 ENCOUNTER — Ambulatory Visit: Admitting: Dermatology

## 2024-05-03 ENCOUNTER — Encounter: Payer: Self-pay | Admitting: Dermatology

## 2024-05-03 DIAGNOSIS — Z7189 Other specified counseling: Secondary | ICD-10-CM | POA: Diagnosis not present

## 2024-05-03 DIAGNOSIS — Z79899 Other long term (current) drug therapy: Secondary | ICD-10-CM

## 2024-05-03 DIAGNOSIS — L409 Psoriasis, unspecified: Secondary | ICD-10-CM

## 2024-05-03 MED ORDER — BIMEKIZUMAB-BKZX 320 MG/2ML ~~LOC~~ SOAJ
320.0000 mg | Freq: Once | SUBCUTANEOUS | Status: AC
  Administered 2024-05-03: 320 mg via SUBCUTANEOUS

## 2024-05-03 NOTE — Progress Notes (Signed)
   Follow-Up Visit   Subjective  Danny Chandler is a 48 y.o. male who presents for the following: 4 weeks f/u on Psoriasis on his hands,feet,arms and legs, started sample  Bimzelx  injection 4 weeks ago, no side effects, patient report his skin is improving, areas are drying up. Patient report Bimzelx  prescription will be shipped to his house tomorrow.  Past treatment Skyrizi  helped in the beginning but stopped helping. Cimiza prescribed by Dr Arlyss several years did not help.  The following portions of the chart were reviewed this encounter and updated as appropriate: medications, allergies, medical history  Review of Systems:  No other skin or systemic complaints except as noted in HPI or Assessment and Plan.  Objective  Well appearing patient in no apparent distress; mood and affect are within normal limits.  Areas Examined: Face,arms,legs,feet hands   Relevant exam findings are noted in the Assessment and Plan.   Assessment & Plan   PSORIASIS   Related Medications bimekizumab -bkzx (BIMZELX ) pen 320 mg   PSORIASIS on systemic treatment Improving on Bimzelx   Well-demarcated erythematous papules/plaques with silvery scale, guttate pink scaly papules.  Greater than 25% BSA. Chronic and persistent condition with duration or expected duration over one year. Condition is symptomatic/ bothersome to patient. Not currently at goal.  Patient denies joint pain Treatment Plan: Continue Bimzelx  injection 320 mg week 8, 12 and 16, then every 8 weeks    Patient self injected sample Bimzlex 320 mg in the office today- right thigh Tolerated well NDC 49525-217-15 LOT [id number] Exp Sept 15, 2027  Lab reviewed  04/06/2024 - TB test / Quantiferon Gold = Negative / Normal   Counseling on psoriasis and coordination of care  psoriasis is a chronic non-curable, but treatable genetic/hereditary disease that may have other systemic features affecting other organ systems such as joints (Psoriatic  Arthritis). It is associated with an increased risk of inflammatory bowel disease, heart disease, non-alcoholic fatty liver disease, and depression.  Treatments include light and laser treatments; topical medications; and systemic medications including oral and injectables.  Long term medication management. Patient is using long term (months to years) prescription medication to control their dermatologic condition. These medications require periodic monitoring to evaluate for efficacy and side effects and may require periodic laboratory monitoring.   Return in about 3 months (around 08/03/2024) for Psoriasis .  IFay Kirks, CMA, am acting as scribe for [name], MD .   Documentation: I have reviewed the above documentation for accuracy and completeness, and I agree with the above.  [name], MD

## 2024-05-03 NOTE — Patient Instructions (Signed)

## 2024-08-09 ENCOUNTER — Ambulatory Visit: Admitting: Dermatology

## 2024-09-20 ENCOUNTER — Ambulatory Visit: Admitting: Dermatology

## 2025-01-25 ENCOUNTER — Ambulatory Visit: Admitting: Dermatology
